# Patient Record
Sex: Female | Born: 1950 | Race: White | Hispanic: No | Smoking: Current some day smoker
Health system: Southern US, Community
[De-identification: ages and names within clinical notes are randomized; demographics above are authoritative.]

## PROBLEM LIST (undated history)

## (undated) DIAGNOSIS — M1990 Unspecified osteoarthritis, unspecified site: Secondary | ICD-10-CM

## (undated) DIAGNOSIS — Z720 Tobacco use: Secondary | ICD-10-CM

## (undated) DIAGNOSIS — F32A Depression, unspecified: Secondary | ICD-10-CM

## (undated) DIAGNOSIS — K635 Polyp of colon: Secondary | ICD-10-CM

## (undated) DIAGNOSIS — K589 Irritable bowel syndrome without diarrhea: Secondary | ICD-10-CM

## (undated) DIAGNOSIS — N200 Calculus of kidney: Secondary | ICD-10-CM

## (undated) HISTORY — DX: Tobacco use: Z72.0

## (undated) HISTORY — DX: Irritable bowel syndrome, unspecified: K58.9

## (undated) HISTORY — PX: OTHER SURGICAL HISTORY: SHX169

## (undated) HISTORY — PX: BREAST BIOPSY: SHX20

## (undated) HISTORY — PX: TUBAL LIGATION: SHX77

## (undated) HISTORY — DX: Unspecified osteoarthritis, unspecified site: M19.90

---

## 2015-03-06 ENCOUNTER — Encounter: Payer: Self-pay | Admitting: Podiatry

## 2015-03-06 ENCOUNTER — Ambulatory Visit (INDEPENDENT_AMBULATORY_CARE_PROVIDER_SITE_OTHER): Payer: Medicare Other | Admitting: Podiatry

## 2015-03-06 ENCOUNTER — Ambulatory Visit (INDEPENDENT_AMBULATORY_CARE_PROVIDER_SITE_OTHER): Payer: Medicare Other

## 2015-03-06 DIAGNOSIS — M2011 Hallux valgus (acquired), right foot: Secondary | ICD-10-CM

## 2015-03-06 DIAGNOSIS — M2021 Hallux rigidus, right foot: Secondary | ICD-10-CM | POA: Diagnosis not present

## 2015-03-06 NOTE — Patient Instructions (Signed)
Pre-Operative Instructions  Congratulations, you have decided to take an important step to improving your quality of life.  You can be assured that the doctors of [facility] will be with you every step of the way.  1. Plan to be at the surgery center/hospital at least 1 (one) hour prior to your scheduled time unless otherwise directed by the surgical center/hospital staff.  You must have a responsible adult accompany you, remain during the surgery and drive you home.  Make sure you have directions to the surgical center/hospital and know how to get there on time. 2. For hospital based surgery you will need to obtain a history and physical form from your family physician within 1 month prior to the date of surgery- we will give you a form for you primary physician.  3. We make every effort to accommodate the date you request for surgery.  There are however, times where surgery dates or times have to be moved.  We will contact you as soon as possible if a change in schedule is required.   4. No Aspirin/Ibuprofen for one week before surgery.  If you are on aspirin, any non-steroidal anti-inflammatory medications (Mobic, Aleve, Ibuprofen) you should stop taking it 7 days prior to your surgery.  You make take Tylenol  For pain prior to surgery.  5. Medications- If you are taking daily heart and blood pressure medications, seizure, reflux, allergy, asthma, anxiety, pain or diabetes medications, make sure the surgery center/hospital is aware before the day of surgery so they may notify you which medications to take or avoid the day of surgery. 6. No food or drink after midnight the night before surgery unless directed otherwise by surgical center/hospital staff. 7. No alcoholic beverages 24 hours prior to surgery.  No smoking 24 hours prior to or 24 hours after surgery. 8. Wear loose pants or shorts- loose enough to fit over bandages, boots, and casts. 9. No slip on shoes, sneakers are best. 10. Bring  your boot with you to the surgery center/hospital.  Also bring crutches or a walker if your physician has prescribed it for you.  If you do not have this equipment, it will be provided for you after surgery. 11. If you have not been contracted by the surgery center/hospital by the day before your surgery, call to confirm the date and time of your surgery. 12. Leave-time from work may vary depending on the type of surgery you have.  Appropriate arrangements should be made prior to surgery with your employer. 13. Prescriptions will be provided immediately following surgery by your doctor.  Have these filled as soon as possible after surgery and take the medication as directed. 14. Remove nail polish on the operative foot. 15. Wash the night before surgery.  The night before surgery wash the foot and leg well with the antibacterial soap provided and water paying special attention to beneath the toenails and in between the toes.  Rinse thoroughly with water and dry well with a towel.  Perform this wash unless told not to do so by your physician.  Enclosed: 1 Ice pack (please put in freezer the night before surgery)   1 Hibiclens skin cleaner   Pre-op Instructions  If you have any questions regarding the instructions, do not hesitate to call our office.  [place]: [address] , [place] [address] [phone]  [place]: [address] [place], [place] [address] [phone]  [place]: [address]  [place], [place] [address] [phone]  Dr. Richard   Tuchman DPM, Dr. Norman Regal DPM Dr. Richard Sikora DPM, Dr. M. Todd Ramona Ruark DPM, Dr. Kathryn Egerton DPM 

## 2015-03-06 NOTE — Progress Notes (Signed)
She presents today as a new patient with a chief complaint pain to the right first metatarsophalangeal joint. She states that it seems to be getting worse over the past 3 months and is becoming intolerable. She states that it has dropped her ability to perform her daily activities and continue to maintain health with exercise. She states that she also has painful ingrown toenails to the hallux bilateral.    objective: vital signs are stable alert and oriented 3. Pulses are strongly palpable. Neurologic sensorium is intact per Semmes-Weinstein monofilament. Deep tendon reflexes are intact bilateral muscle strength +5 over 5 dorsiflexors plantar flexors and inverters and evertors onto the musculature is intact. Orthopedic evaluation demonstrates less than 5 of motion dorsiflexion or plantarflexion at the level of the first metatarsophalangeal joint right foot. Exquisitely painful on palpation. Severe pes planus is noted bilateral. Radiographs do demonstrate moderate to severe pes planus with an elevated first metatarsal subchondral sclerosis of the first metatarsophalangeal joint dorsal spurring and eburnation. All of this is consistent with hallux rigidus and pes planus. Cutaneous evaluation does demonstrate no open lesions or wounds. She does have probable onychomycosis of the hallux nail plate right foot but does demonstrate onychocryptosis of the bilateral hallux nails.   Assessment: ingrown toenails hallux bilateral. 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 Pes planus bilateral. Onychomycosis hallux nail plate right.  Plan: discussed etiology pathology conservative versus surgical therapies. At this point we went over a consent form today line by line number by number giving her ample time to ask questions she saw fit Re: Jake Michaelis arthroplasty with a single silicone implant right foot as well as surgical matrixectomy's tibial and fibular border of the hallux bilateral. We discussed the  pros and cons of the surgery as well as possible complications which may include but are not limited to postop pain bleeding swelling infection recurrence need for further surgery overcorrection under correction loss of digit loss of limb loss of life. We dispensed a cam walker today for her  Postop recovery time. She signed other days of the consent form and I will follow-up with her in the near future. She will allow the hallux nail plate to grow so then I may clip it an sample for fungus.

## 2015-03-09 ENCOUNTER — Telehealth: Payer: Self-pay | Admitting: *Deleted

## 2015-03-09 NOTE — Telephone Encounter (Signed)
"  I'm needing surgery.  I saw Dr. Milinda Pointer yesterday and was told to call you."  Dr. Stephenie Acres next available is 04/21/2015.  Would you like to do it then?  "No, I better do it the end of the month.  I have family coming to visit me.  Can he do it March 31?"  Yes, that date is fine.  I'll schedule then.  You can go ahead and register.  The surgical center will call you with the arrival time a day or two prior to surgery date.

## 2015-03-23 ENCOUNTER — Other Ambulatory Visit: Payer: Self-pay | Admitting: Otolaryngology

## 2015-03-23 DIAGNOSIS — R42 Dizziness and giddiness: Secondary | ICD-10-CM

## 2015-04-06 ENCOUNTER — Ambulatory Visit
Admission: RE | Admit: 2015-04-06 | Discharge: 2015-04-06 | Disposition: A | Discharge status: Home / Self Care | Payer: Medicare Other | Source: Ambulatory Visit | Attending: Otolaryngology | Admitting: Otolaryngology

## 2015-04-06 DIAGNOSIS — R42 Dizziness and giddiness: Secondary | ICD-10-CM | POA: Diagnosis not present

## 2015-04-06 LAB — POCT I-STAT CREATININE: Creatinine, Ser: 0.9 mg/dL (ref 0.44–1.00)

## 2015-04-06 MED ORDER — GADOBENATE DIMEGLUMINE 529 MG/ML IV SOLN
15.0000 mL | Freq: Once | INTRAVENOUS | Status: AC | PRN
  Administered 2015-04-06: 13 mL via INTRAVENOUS

## 2015-05-11 ENCOUNTER — Other Ambulatory Visit: Payer: Self-pay | Admitting: Podiatry

## 2015-05-11 ENCOUNTER — Telehealth: Payer: Self-pay

## 2015-05-11 MED ORDER — PROMETHAZINE HCL 25 MG PO TABS: 25.0000 mg | ORAL_TABLET | Freq: Three times a day (TID) | ORAL | Status: DC | PRN

## 2015-05-11 MED ORDER — HYDROMORPHONE HCL 4 MG PO TABS: 4.0000 mg | ORAL_TABLET | ORAL | Status: DC | PRN

## 2015-05-11 MED ORDER — CEPHALEXIN 500 MG PO CAPS: 500.0000 mg | ORAL_CAPSULE | Freq: Three times a day (TID) | ORAL | Status: DC

## 2015-05-11 NOTE — Telephone Encounter (Signed)
Pt called this morning stating that her insurance called her this morning and told her that her surgery was not approved. She cancelled the surgery. [place] was notified, V. HIll took call from [employer]

## 2015-05-15 ENCOUNTER — Ambulatory Visit (INDEPENDENT_AMBULATORY_CARE_PROVIDER_SITE_OTHER): Payer: Medicare Other | Admitting: Podiatry

## 2015-05-15 ENCOUNTER — Encounter: Payer: Self-pay | Admitting: Podiatry

## 2015-05-15 DIAGNOSIS — L603 Nail dystrophy: Secondary | ICD-10-CM | POA: Diagnosis not present

## 2015-05-15 DIAGNOSIS — M7751 Other enthesopathy of right foot: Secondary | ICD-10-CM | POA: Diagnosis not present

## 2015-05-15 DIAGNOSIS — M5432 Sciatica, left side: Secondary | ICD-10-CM | POA: Diagnosis not present

## 2015-05-15 DIAGNOSIS — M779 Enthesopathy, unspecified: Secondary | ICD-10-CM

## 2015-05-15 DIAGNOSIS — M2021 Hallux rigidus, right foot: Secondary | ICD-10-CM

## 2015-05-15 DIAGNOSIS — M778 Other enthesopathies, not elsewhere classified: Secondary | ICD-10-CM

## 2015-05-15 MED ORDER — METHYLPREDNISOLONE 4 MG PO TBPK: ORAL_TABLET | ORAL | Status: DC

## 2015-05-15 MED ORDER — MELOXICAM 15 MG PO TABS: 15.0000 mg | ORAL_TABLET | Freq: Every day | ORAL | Status: DC

## 2015-05-15 NOTE — Progress Notes (Signed)
She presents today for follow-up of her severely painful first metatarsophalangeal joint of her right foot. She states that her foot is so painful she can hardly perform her daily activities. She has been treated in the past for the same foot but in [facility]. She states that it seems the insurance does not want her to get better so she can continue a healthy way of life. She is also complaining of a thickened discolored hallux nail plate right. She would like to go ahead and have this much conservative therapy is possible so that she may be able to at some point have a surgical correction.  Objective: Vital signs are stable she is alert and oriented 3. Pulses are strongly palpable. She has severe pain on range of motion of the first metatarsophalangeal joint right foot. There is a large spur resulting in near ulceration to the dorsal medial aspect of the first metatarsophalangeal joint this is limiting her ability to wear good shoes gear. She has pain throughout the rest of the foot particularly right because of her compensation for the first metatarsophalangeal joint. She does have discoloration of the hallux nail plate right. This nail plate does demonstrate what appears to be either a yeast or fungus.  Assessment: Chronic intractable hallux rigidus with severe pain about the first metatarsophalangeal joint right foot. Nail dystrophy hallux right.  Plan: Discussed etiology pathology conservative versus surgical therapies. I recommended an injection today of Kenalog and local anesthetic. Perform this after sterile Betadine skin prep. Also placed her on a Medrol Dosepak to be followed by meloxicam. We discussed appropriate shoe gear stretching exercises and ice therapy we did sample of the toenail today sent for pathologic evaluation and I will follow-up with her in the near future.

## 2015-05-17 ENCOUNTER — Encounter: Payer: Self-pay | Admitting: Podiatry

## 2015-06-14 ENCOUNTER — Ambulatory Visit: Payer: Medicare Other | Admitting: Podiatry

## 2015-06-28 ENCOUNTER — Ambulatory Visit (INDEPENDENT_AMBULATORY_CARE_PROVIDER_SITE_OTHER): Payer: Medicare Other

## 2015-06-28 ENCOUNTER — Ambulatory Visit (INDEPENDENT_AMBULATORY_CARE_PROVIDER_SITE_OTHER): Payer: Medicare Other | Admitting: Podiatry

## 2015-06-28 ENCOUNTER — Encounter: Payer: Self-pay | Admitting: Podiatry

## 2015-06-28 DIAGNOSIS — M7751 Other enthesopathy of right foot: Secondary | ICD-10-CM | POA: Diagnosis not present

## 2015-06-28 DIAGNOSIS — M779 Enthesopathy, unspecified: Secondary | ICD-10-CM

## 2015-06-28 DIAGNOSIS — M2021 Hallux rigidus, right foot: Secondary | ICD-10-CM | POA: Diagnosis not present

## 2015-06-28 DIAGNOSIS — M778 Other enthesopathies, not elsewhere classified: Secondary | ICD-10-CM

## 2015-06-28 NOTE — Progress Notes (Signed)
She presents today for follow-up of her hallux limitus first metatarsal-phalangeal joint right foot. She states that after the last injection it felt much better. She still has some tenderness to the plantar aspect of the first metatarsophalangeal joint right. We took a nail sample last visit which we will discuss today.  Objective: Vital signs are stable she's alert and oriented 3 she has tenderness on palpation plantar aspect of the first metatarsophalangeal joint right. Pathology report does demonstrate negative fungus.  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 Reinjected plantar aspect of the first metatarsophalangeal joint today with Kenalog and local anesthetic right foot. I will follow up with her in 6 weeks at which time we will discuss a Keller arthroplasty with a single silicone implant right foot to be performed in August.

## 2015-08-02 ENCOUNTER — Encounter: Payer: Self-pay | Admitting: Podiatry

## 2015-08-02 ENCOUNTER — Ambulatory Visit (INDEPENDENT_AMBULATORY_CARE_PROVIDER_SITE_OTHER): Payer: Medicare Other | Admitting: Podiatry

## 2015-08-02 DIAGNOSIS — M7751 Other enthesopathy of right foot: Secondary | ICD-10-CM | POA: Diagnosis not present

## 2015-08-02 DIAGNOSIS — M2021 Hallux rigidus, right foot: Secondary | ICD-10-CM | POA: Diagnosis not present

## 2015-08-02 DIAGNOSIS — M778 Other enthesopathies, not elsewhere classified: Secondary | ICD-10-CM

## 2015-08-02 DIAGNOSIS — M779 Enthesopathy, unspecified: Secondary | ICD-10-CM

## 2015-08-02 NOTE — Progress Notes (Signed)
She presents today for surgical consult regarding hallux limitus first metatarsophalangeal joint right foot. She states that it is more painful than ever I'm really disappointed that insurance did not allow me to have the surgery performed initially because now it has started to affect all facets of life. She states that the last injection seemed to alleviate symptoms for a few days and she would like to try another injection. She is ready to have the surgery performed as soon as possible.  Objective: Vital signs are stable alert and oriented 3. Pulses are palpable. Neurologic sensorium is intact per Semmes-Weinstein monofilament. She has severe pain no palpation and range of motion of first metatarsophalangeal joint right foot. Radiographs reviewed today do demonstrate joint space narrowing subchondral sclerosis dorsal spurring and bone-on-bone contact.  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  Plan: We went over a consent form today line by line number by number giving her ample time to ask questions she sought that regarding this procedure she re-signed the same consent form that she had signed in the past and we discussed the possible postop complications. I also injected 1 mg of dexamethasone into the first metatarsophalangeal joint with local anesthetic. This alleviated her symptoms immediately.

## 2015-08-02 NOTE — Patient Instructions (Signed)

## 2016-03-22 ENCOUNTER — Ambulatory Visit (INDEPENDENT_AMBULATORY_CARE_PROVIDER_SITE_OTHER): Payer: Medicare Other | Admitting: Podiatry

## 2016-03-22 VITALS — BP 98/72 | HR 84

## 2016-03-22 DIAGNOSIS — M7751 Other enthesopathy of right foot: Secondary | ICD-10-CM

## 2016-03-22 DIAGNOSIS — M205X1 Other deformities of toe(s) (acquired), right foot: Secondary | ICD-10-CM | POA: Diagnosis not present

## 2016-03-22 DIAGNOSIS — M21611 Bunion of right foot: Secondary | ICD-10-CM

## 2016-03-22 DIAGNOSIS — M2011 Hallux valgus (acquired), right foot: Secondary | ICD-10-CM | POA: Diagnosis not present

## 2016-04-05 MED ORDER — BETAMETHASONE SOD PHOS & ACET 6 (3-3) MG/ML IJ SUSP: 3.0000 mg | Freq: Once | INTRAMUSCULAR | Status: DC

## 2016-04-05 NOTE — Progress Notes (Signed)
   Subjective:  Patient presents today for follow-up evaluation of hallux limitus with bunion deformity first metatarsal phalangeal joint right foot. She states that approximately 2 weeks ago she began to experience significant pain. Patient was last seen 08/02/2015 under the care of Dr. Milinda Pointer. At that time a surgical consult was given regarding hallux limitus surgery right foot. Patient presents today for follow-up treatment and evaluation    Objective/Physical Exam General: The patient is alert and oriented x3 in no acute distress.  Dermatology: Skin is warm, dry and supple bilateral lower extremities. Negative for open lesions or macerations.  Vascular: Palpable pedal pulses bilaterally. No edema or erythema noted. Capillary refill within normal limits.  Neurological: Epicritic and protective threshold grossly intact bilaterally.   Musculoskeletal Exam: Pain on palpation and range of motion to the first metatarsal phalangeal joint right foot. Minimal edema noted. Range of motion within normal limits to all pedal and ankle joints bilateral. Muscle strength 5/5 in all groups bilateral.   Assessment: #1 hallux limitus with bunion deformity first metatarsal phalangeal joint right foot. #2 metatarsal phalangeal joint capsulitis right foot   Plan of Care:  #1 Patient was evaluated. #2 injection of 0.5 mL Celestone Soluspan injected in the first MPJ right foot to alleviate symptoms and acute pain #3 recommend follow-up with Dr. Milinda Pointer for possible surgical consult and intervention.   Edrick Kins, DPM [facility]  Dr. Edrick Kins, [place]                                        [place], [place] [address]                Office [phone]  Fax [phone]

## 2016-07-24 ENCOUNTER — Ambulatory Visit (INDEPENDENT_AMBULATORY_CARE_PROVIDER_SITE_OTHER): Payer: Medicare Other | Admitting: Podiatry

## 2016-07-24 ENCOUNTER — Encounter: Payer: Self-pay | Admitting: Podiatry

## 2016-07-24 DIAGNOSIS — M2011 Hallux valgus (acquired), right foot: Secondary | ICD-10-CM | POA: Diagnosis not present

## 2016-07-24 DIAGNOSIS — L600 Ingrowing nail: Secondary | ICD-10-CM

## 2016-07-24 DIAGNOSIS — M205X1 Other deformities of toe(s) (acquired), right foot: Secondary | ICD-10-CM

## 2016-07-24 NOTE — Progress Notes (Signed)
Presents today for a follow-up of her hallux limitus. She states that on here schedule surgery but I really like to have an injection.  Objective: Vital signs are stable she is alert and oriented 3 still has severe pain on palpation and range of motion of the first metatarsophalangeal joint of the right foot. I revisited her old radiographs which do demonstrate joint space narrowing supposed porosis and dorsal spurring. She has pain on end range of motion today and physical exam and distraction of the toe.  Assessment: Hallux limitus hallux rigidus first metatarsophalangeal joint right foot capsulitis first metatarsophalangeal joint right foot.  Plan: I injected the area today after sterile Betadine skin prep with dexamethasone and local anesthetic. I also scheduled for surgical intervention today consisting of a Austin bunion repair with screw fixation possible Keller arthroplasty with a single silicone implant and matrixectomy tibial fibular border of the hallux bilateral. We did discuss possible postop complications which may include but are not limited to postop pain bleeding swelling infection recurrence need for further surgery. I answered all the questions regarding these procedures best my ability limits times. We dispensed a cam walker for her postop recovery period. Dispense both oral and written home-going instructions for the care and preop instructions. Follow up with her in the near future surgical intervention.

## 2016-07-24 NOTE — Patient Instructions (Signed)
Pre-Operative Instructions  Congratulations, you have decided to take an important step to improving your quality of life.  You can be assured that the doctors of [facility] will be with you every step of the way.  1. Plan to be at the surgery center/hospital at least 1 (one) hour prior to your scheduled time unless otherwise directed by the surgical center/hospital staff.  You must have a responsible adult accompany you, remain during the surgery and drive you home.  Make sure you have directions to the surgical center/hospital and know how to get there on time. 2. For hospital based surgery you will need to obtain a history and physical form from your family physician within 1 month prior to the date of surgery- we will give you a form for you primary physician.  3. We make every effort to accommodate the date you request for surgery.  There are however, times where surgery dates or times have to be moved.  We will contact you as soon as possible if a change in schedule is required.   4. No Aspirin/Ibuprofen for one week before surgery.  If you are on aspirin, any non-steroidal anti-inflammatory medications (Mobic, Aleve, Ibuprofen) you should stop taking it 7 days prior to your surgery.  You make take Tylenol  For pain prior to surgery.  5. Medications- If you are taking daily heart and blood pressure medications, seizure, reflux, allergy, asthma, anxiety, pain or diabetes medications, make sure the surgery center/hospital is aware before the day of surgery so they may notify you which medications to take or avoid the day of surgery. 6. No food or drink after midnight the night before surgery unless directed otherwise by surgical center/hospital staff. 7. No alcoholic beverages 24 hours prior to surgery.  No smoking 24 hours prior to or 24 hours after surgery. 8. Wear loose pants or shorts- loose enough to fit over bandages, boots, and casts. 9. No slip on shoes, sneakers are best. 10. Bring  your boot with you to the surgery center/hospital.  Also bring crutches or a walker if your physician has prescribed it for you.  If you do not have this equipment, it will be provided for you after surgery. 11. If you have not been contracted by the surgery center/hospital by the day before your surgery, call to confirm the date and time of your surgery. 12. Leave-time from work may vary depending on the type of surgery you have.  Appropriate arrangements should be made prior to surgery with your employer. 13. Prescriptions will be provided immediately following surgery by your doctor.  Have these filled as soon as possible after surgery and take the medication as directed. 14. Remove nail polish on the operative foot. 15. Wash the night before surgery.  The night before surgery wash the foot and leg well with the antibacterial soap provided and water paying special attention to beneath the toenails and in between the toes.  Rinse thoroughly with water and dry well with a towel.  Perform this wash unless told not to do so by your physician.  Enclosed: 1 Ice pack (please put in freezer the night before surgery)   1 Hibiclens skin cleaner   Pre-op Instructions  If you have any questions regarding the instructions, do not hesitate to call our office.  [place]: [address] [place], [place] [address] [phone]  [place]: [address] [place], [place] [address] [phone]  [place]: [address]  [place], [place] [address] [phone]   Dr.   Norman Regal DPM, Dr. Matthew Wagoner DPM, Dr. M. Todd Darnell Jeschke DPM, Dr. Titorya Stover DPM 

## 2016-07-26 ENCOUNTER — Telehealth: Payer: Self-pay | Admitting: *Deleted

## 2016-07-26 NOTE — Telephone Encounter (Signed)
I'm returning your call.  Do you have a date in mind that you would like to schedule surgery?  "What is his first available?"  He an do it on June 28.  "That is too soon.  I will have to wait because I'm in a play.  What is available after that?"  He can do it July 6, 20 or 27.  "Let me call you back.  I am driving, on my way home.  I'll call you back when I get there and check my calendar.  Is it okay to leave you a message?"  Yes, you can leave me a message.

## 2016-07-26 NOTE — Telephone Encounter (Signed)
I'm calling you back.  You gave me the option of July 6 for foot surgery.  I would like that date.  I have [facility].  Can you be sure and let me know that they will cover the surgery?"

## 2016-07-26 NOTE — Telephone Encounter (Signed)
"  Dr. Milinda Pointer I think got paperwork to you yesterday about scheduling foot surgery.  I'd like to do that as soon as possible.  If you could call me, I'd greatly appreciate it."

## 2016-07-29 NOTE — Telephone Encounter (Addendum)
"  I just wanted to make sure you got my message that I wanted July 6 for my foot surgery.  Call and confirm that my [facility] is going to cover it for sure because I have to line up sitters for the dog and all this stuff.  Just let me know when you know.  I called and left her a message that I have her scheduled for July 6.  I will have to put in a prior authorization with [facility].  I will call once I get a response from [facility].

## 2016-08-13 ENCOUNTER — Other Ambulatory Visit: Payer: Self-pay | Admitting: Podiatry

## 2016-08-13 MED ORDER — CEPHALEXIN 500 MG PO CAPS: 500.0000 mg | ORAL_CAPSULE | Freq: Three times a day (TID) | ORAL | 0 refills | Status: DC

## 2016-08-13 MED ORDER — HYDROMORPHONE HCL 4 MG PO TABS: 4.0000 mg | ORAL_TABLET | ORAL | 0 refills | Status: DC | PRN

## 2016-08-13 MED ORDER — PROMETHAZINE HCL 25 MG PO TABS: 25.0000 mg | ORAL_TABLET | Freq: Three times a day (TID) | ORAL | 0 refills | Status: DC | PRN

## 2016-08-13 NOTE — Telephone Encounter (Signed)
I am calling to let you know that authorization is not required from your insurance.  "That's great to hear that they will cover it.  My insurance last year decided the day before that it wasn't covered.  So I have a deductible, I guess I will have to pay at $100 when I get to the facility, correct?"  I am not sure what you may owe on their end.  On our end, you do not have a deductible.  "Okay, well praise God.  Thank you so much Sally Wilson."

## 2016-08-16 DIAGNOSIS — L600 Ingrowing nail: Secondary | ICD-10-CM | POA: Diagnosis not present

## 2016-08-16 DIAGNOSIS — M2011 Hallux valgus (acquired), right foot: Secondary | ICD-10-CM | POA: Diagnosis not present

## 2016-08-21 ENCOUNTER — Telehealth: Payer: Self-pay | Admitting: Podiatry

## 2016-08-21 MED ORDER — NONFORMULARY OR COMPOUNDED ITEM: 2 refills | Status: DC

## 2016-08-21 NOTE — Telephone Encounter (Signed)
I had surgery done by Dr. Milinda Pointer this past Friday 06 July. Everything is going good. However, the antibiotic prescribed has caused me to have both non-allergic and allergic reactions. I now have thrush (a rash) on the inside of my mouth, on my tongue, and my genital's. I don't know what to do. What should I do about the fungal infection that I now have? I do not want to take another antibiotic today as I took one on Friday, Saturday, Sunday, Monday, and Tuesday. Please call me back at [phone].

## 2016-08-21 NOTE — Telephone Encounter (Addendum)
I spoke with pt, she informed me of her current symptoms, and denies any shortness of breath or difficulty breathing. I told her to stop the cephalexin and begin benadryl and I would see if the doctor on call wanted to change the antibiotic and prescribe a medication for the thrush. Pt states understanding. Dr. Amalia Hailey ordered discontinue the cephalexin, and go to PCP if continued problem with sores in the mouth, and ED if shortness of breath or difficulty breathing, and Duke's Magic Mouthwash as directed. Orders to pt. [facility] - [phone], I spoke with pharmacist and she states they do mix the Duke's Magic Mouthwash - Benadryl, Hydrocortisone tablet and Nystatin swish and swallow 44ml 3-4 times daily.

## 2016-08-23 ENCOUNTER — Ambulatory Visit (INDEPENDENT_AMBULATORY_CARE_PROVIDER_SITE_OTHER): Payer: Self-pay | Admitting: Podiatry

## 2016-08-23 ENCOUNTER — Ambulatory Visit (INDEPENDENT_AMBULATORY_CARE_PROVIDER_SITE_OTHER): Payer: Medicare Other

## 2016-08-23 VITALS — BP 109/76 | HR 78 | Temp 98.2°F | Resp 16

## 2016-08-23 DIAGNOSIS — Z9889 Other specified postprocedural states: Secondary | ICD-10-CM

## 2016-08-23 DIAGNOSIS — M79671 Pain in right foot: Secondary | ICD-10-CM | POA: Diagnosis not present

## 2016-08-23 MED ORDER — AMOXICILLIN-POT CLAVULANATE 875-125 MG PO TABS: 1.0000 | ORAL_TABLET | Freq: Two times a day (BID) | ORAL | 0 refills | Status: DC

## 2016-08-23 MED ORDER — FLUCONAZOLE 150 MG PO TABS: 150.0000 mg | ORAL_TABLET | Freq: Once | ORAL | 0 refills | Status: AC

## 2016-09-01 NOTE — Progress Notes (Signed)
   Subjective:  Patient presents today status post right bunionectomy of bilateral ingrown toenail removal performed by Dr. Milinda Pointer. Date of surgery 08/16/2016. Patient states that she's doing well and that the pain is tolerable. Patient complains of slight redness and swelling to the left great toe. She states that the right great toenail is doing okay.    Objective/Physical Exam Skin incisions appear to be well coapted with sutures and staples intact. No sign of infectious process noted. No dehiscence. No active bleeding noted. Moderate edema noted to the surgical extremity. There is some mild erythema localized to the left great toe.  Radiographic Exam:  Orthopedic hardware and osteotomies sites appear to be stable with routine healing.  Assessment: 1. s/p Austin bunionectomy right with bilateral ingrown toenail removal procedures 2. Mild cellulitis left great toe  Plan of Care:  1. Patient was evaluated. X-rays reviewed 2. Prescription for Augmentin placed. Discontinue Keflex since it caused her oral thrush and yeast infection reaction 3. Prescription for Diflucan to address [place] infection secondary to foot surgery empiric treatment 4. Return to clinic in 1 week with Dr. Milinda Pointer   Edrick Kins, DPM [facility]  Dr. Edrick Kins, [place]                                        [place], [place] [address]                Office [phone]  Fax [phone]

## 2016-09-02 ENCOUNTER — Ambulatory Visit (INDEPENDENT_AMBULATORY_CARE_PROVIDER_SITE_OTHER): Payer: Medicare Other | Admitting: Podiatry

## 2016-09-02 ENCOUNTER — Encounter: Payer: Self-pay | Admitting: Podiatry

## 2016-09-02 DIAGNOSIS — M205X1 Other deformities of toe(s) (acquired), right foot: Secondary | ICD-10-CM | POA: Diagnosis not present

## 2016-09-02 DIAGNOSIS — L600 Ingrowing nail: Secondary | ICD-10-CM

## 2016-09-02 NOTE — Progress Notes (Signed)
She presents today for follow-up of her Liane Comber bunionectomy right and her excision of nails hallux bilaterally. She states that she is doing pretty good it is burns on the sides of those toes with a matrixectomy was performed. She denies fever chills nausea or muscle aches and pains measurements of breath Pain or chest pain. Date of surgery 08/16/2016.  Objective: Dry sterile dressing intact was removed demonstrates sutures are intact margins well coapted. No erythema cellulitis drainage or odor. Good range of motion of the first metatarsophalangeal joint of the right foot status post [place] and single silicone implant mildly tender.  Assessment: Date of surgery 08/16/2016. Status post Keller arthroplasty single silicone implant right foot matrixectomy hallux bilateral.  Plan: Posterior compression anklet today removed all of the sutures left for getting this wet follow-up with her in 2 weeks for another set of x-rays encourage range of motion exercises demonstrated in for her follow-up with her at that time.

## 2016-09-11 ENCOUNTER — Ambulatory Visit (INDEPENDENT_AMBULATORY_CARE_PROVIDER_SITE_OTHER): Payer: Self-pay | Admitting: Podiatry

## 2016-09-11 DIAGNOSIS — L03032 Cellulitis of left toe: Secondary | ICD-10-CM

## 2016-09-11 MED ORDER — DOXYCYCLINE HYCLATE 100 MG PO TABS: 100.0000 mg | ORAL_TABLET | Freq: Two times a day (BID) | ORAL | 0 refills | Status: DC

## 2016-09-11 MED ORDER — NEOMYCIN-POLYMYXIN-HC 3.5-10000-1 OT SOLN: OTIC | 0 refills | Status: DC

## 2016-09-11 NOTE — Progress Notes (Signed)
She presents today for follow-up of her bunionectomy right foot and matrixectomy's hallux bilaterally. Date of surgery was July 6. She is concerned about the swelling and the drainage from the left hallux matrixectomy site. She states that 2 antibiotics Keflex which she was allergic to and amoxicillin she could not take very well really has not helped at all. She continues to soak twice a day.  Objective: She denies fever chills nausea vomiting muscle aches and pains. Pulses are strongly palpable left foot. Mild erythema with serosanguineous drainage along the eponychial of the left hallux.  Assessment: Paronychia left hallux.  Plan: We will start her on once a day Epsom salt soaks also want to start her on Cortisporin Otic be applied after soaking daily. She will cover the toe during the day but leave open at bedtime. She will also start doxycycline. Should she have Problems taken that she will notify us immediately.

## 2016-09-16 ENCOUNTER — Encounter: Payer: Medicare Other | Admitting: Podiatry

## 2016-09-23 ENCOUNTER — Encounter: Payer: Self-pay | Admitting: Podiatry

## 2016-09-23 ENCOUNTER — Ambulatory Visit (INDEPENDENT_AMBULATORY_CARE_PROVIDER_SITE_OTHER): Payer: Medicare Other

## 2016-09-23 ENCOUNTER — Ambulatory Visit (INDEPENDENT_AMBULATORY_CARE_PROVIDER_SITE_OTHER): Payer: Medicare Other | Admitting: Podiatry

## 2016-09-23 DIAGNOSIS — M205X1 Other deformities of toe(s) (acquired), right foot: Secondary | ICD-10-CM

## 2016-09-23 DIAGNOSIS — L600 Ingrowing nail: Secondary | ICD-10-CM

## 2016-09-23 DIAGNOSIS — S92501A Displaced unspecified fracture of right lesser toe(s), initial encounter for closed fracture: Secondary | ICD-10-CM | POA: Diagnosis not present

## 2016-09-23 NOTE — Progress Notes (Signed)
She presents today postop visit date of surgery 08/16/2016 status post Jake Michaelis arthroplasty single silicone implant right foot excision nail hallux bilateral. She also states that she hit her little toe on a chair in her bathroom.  Objective: Vital signs are stable alert and oriented 3. Pulses are palpable. Neurologic sensory is intact. Deep tendon reflexes are intact muscle strength +5 over 5 dorsiflexion plantar flexors and inverters everters great first metatarsophalangeal joint range of motion swollen ecchymotic fifth digit of the right foot painful palpation at the level of metatarsophalangeal joint or proximal proximal phalanx. Radiographs taken today do demonstrate a complete fracture of the base of the proximal phalanx does appear to be intra-articular minimally displaced.  Assessment: Well-healing surgical foot fracture fifth digit right.  Plan: Wrap the fifth toe for her today will follow up with her in a month for another set of x-rays.

## 2016-10-16 ENCOUNTER — Encounter: Payer: Self-pay | Admitting: Podiatry

## 2016-10-16 NOTE — Progress Notes (Signed)
DOS [id number] [place] BUNIONECTOMY RIGHT POSSIBLE KELLER BUNION IMPLANT RIGHT EXC NAILPERM 1ST B/L

## 2016-10-21 ENCOUNTER — Encounter: Payer: Medicare Other | Admitting: Podiatry

## 2016-10-28 ENCOUNTER — Ambulatory Visit (INDEPENDENT_AMBULATORY_CARE_PROVIDER_SITE_OTHER): Payer: Medicare Other

## 2016-10-28 ENCOUNTER — Ambulatory Visit (INDEPENDENT_AMBULATORY_CARE_PROVIDER_SITE_OTHER): Payer: Self-pay | Admitting: Podiatry

## 2016-10-28 ENCOUNTER — Encounter: Payer: Self-pay | Admitting: Podiatry

## 2016-10-28 DIAGNOSIS — M205X1 Other deformities of toe(s) (acquired), right foot: Secondary | ICD-10-CM

## 2016-10-28 DIAGNOSIS — S92501D Displaced unspecified fracture of right lesser toe(s), subsequent encounter for fracture with routine healing: Secondary | ICD-10-CM

## 2016-10-28 DIAGNOSIS — L600 Ingrowing nail: Secondary | ICD-10-CM

## 2016-10-28 NOTE — Progress Notes (Signed)
She presents today for a follow-up of her Sally Wilson arthroplasty single silicone implant right foot. She states that she was doing very well until she hurt her toe ball dancing in a bar with a stranger. She states that she went up on her toe and twisted and felt a sharp pain.  Objective: Vital signs are stable she is alert and oriented 3. Pulses are palpable area she has pain on palpation of the hallux interphalangeal joint and the distal most aspect of the proximal phalanx. Radiographs taken today do demonstrate a nondisplaced fracture distal aspect of the proximal phalanx.  Assessment: I'm concerned along see her fracture like this because of the distal stem. Fracture fifth toe as well as proximal phalanx.  Plan: Follow up with me in 1 month and I recommended that she wear her Darco shoe.

## 2016-11-27 ENCOUNTER — Ambulatory Visit: Payer: Medicare Other | Admitting: Podiatry

## 2016-12-09 ENCOUNTER — Ambulatory Visit: Payer: Medicare Other | Admitting: Podiatry

## 2016-12-09 NOTE — Progress Notes (Signed)
Cardiology Office Note  Date:  12/10/2016   ID:  Sally Wilson, DOB 09/08/1950, MRN [id number]  PCP:  Patient, No Pcp Per   Chief Complaint  Patient presents with  . Other    New Patient. Self referral Patietn states she has not been seen in about 7 years and just wants a check up. Meds reviewed verbally with patient.     HPI:  Sally Wilson is a 66 year old woman with history of Smoking since age 53 No major cardiac issues per the patient Moved from [place] 2 years, previously followed by cardiology in [place] Husband died of PE No diabetes No HTN No high cholersterol per the patient Who presents to establish care in the [place] office with history of coronary disease and CHF  Smokes 1 pack/day No regular exercise program Denies any significant chest pain on exertion No significant lower extremity edema, PND, shortness of breath  Reports having stress test perhaps 7 years ago, was told everything was okay Records have been requested  EKG personally reviewed by myself on todays visit Shows normal sinus rhythm with rate 74 bpm T wave abnormality 3 and aVF left axis deviation No prior EKGs for comparison  Family hx  Father had stent, CHF   PMH:   has a past medical history of Arthritis and IBS (irritable bowel syndrome).  PSH:   History reviewed. No pertinent surgical history.  Current Outpatient Prescriptions  Medication Sig Dispense Refill  . clonazePAM (KLONOPIN) 1 MG tablet   0  . PAXIL CR 25 MG 24 hr tablet   0  . traZODone (DESYREL) 100 MG tablet   0   Current Facility-Administered Medications  Medication Dose Route Frequency Provider Last Rate Last Dose  . betamethasone acetate-betamethasone sodium phosphate (CELESTONE) injection 3 mg  3 mg Intramuscular Once Edrick Kins, DPM         Allergies:   Cephalexin and Codeine   Social History:  The patient  reports that she has been smoking.  She has never used smokeless tobacco. She reports that she does not  drink alcohol or use drugs.   Family History:   family history is not on file.    Review of Systems: Review of Systems  Constitutional: Negative.   Respiratory: Negative.   Cardiovascular: Negative.   Gastrointestinal: Negative.   Musculoskeletal: Negative.   Neurological: Negative.   Psychiatric/Behavioral: Negative.   All other systems reviewed and are negative.    PHYSICAL EXAM: VS:  BP 110/66 (BP Location: Left Arm, Patient Position: Sitting, Cuff Size: Normal)   Pulse 74   Ht 5' 6.5" (1.689 m)   Wt 126 lb 8 oz (57.4 kg)   BMI 20.11 kg/m  , BMI Body mass index is 20.11 kg/m. GEN: Thin,  well developed, in no acute distress  HEENT: normal  Neck: no JVD, carotid bruits, or masses Cardiac: RRR; no murmurs, rubs, or gallops,no edema  Respiratory:  clear to auscultation bilaterally, normal work of breathing GI: soft, nontender, nondistended, + BS MS: no deformity or atrophy  Skin: warm and dry, no rash Neuro:  Strength and sensation are intact Psych: euthymic mood, full affect    Recent Labs: No results found for requested labs within last 8760 hours.    Lipid Panel No results found for: CHOL, HDL, LDLCALC, TRIG    Wt Readings from Last 3 Encounters:  12/10/16 126 lb 8 oz (57.4 kg)       ASSESSMENT AND PLAN:  Smoker - Plan: EKG 12-Lead,  CT CARDIAC SCORING Long discussion concerning her smoking, 1 pack/day since age 65 Discussed various strategies for smoking cessation More than 5 minutes spent discussing with her She has tried various modalities in the past, does not want Chantix at this time She will try to quit on her own Partner/boyfriend also smokes  Family history of coronary arteriosclerosis - Plan: EKG 12-Lead, CT CARDIAC SCORING Discussed her family history with her, discussed various types of risk stratification techniques/imaging studies including CT coronary calcium scoring and carotid ultrasound  Abnormal EKG - Plan: EKG 12-Lead, CT CARDIAC  SCORING Nonspecific T wave abnormality inferior leads III and aVF she is asymptomatic, no prior EKGs available for comparison.  Discussed various imaging risk stratification studies available and stress testing.  Discussed risk and benefit of each study She prefers to have CT coronary calcium scoring in January 2019 She will call to schedule on her own  Disposition:   F/U as needed   Total encounter time more than 45 minutes  Greater than 50% was spent in counseling and coordination of care with the patient    Orders Placed This Encounter  Procedures  . CT CARDIAC SCORING  . EKG 12-Lead     Signed, Esmond Plants, M.D., Ph.D. 12/10/2016  [place], [place]

## 2016-12-10 ENCOUNTER — Encounter: Payer: Self-pay | Admitting: Cardiovascular Disease

## 2016-12-10 ENCOUNTER — Ambulatory Visit (INDEPENDENT_AMBULATORY_CARE_PROVIDER_SITE_OTHER): Payer: Medicare Other | Admitting: Cardiovascular Disease

## 2016-12-10 VITALS — BP 110/66 | HR 74 | Ht 66.5 in | Wt 126.5 lb

## 2016-12-10 DIAGNOSIS — R9431 Abnormal electrocardiogram [ECG] [EKG]: Secondary | ICD-10-CM

## 2016-12-10 DIAGNOSIS — Z720 Tobacco use: Secondary | ICD-10-CM | POA: Insufficient documentation

## 2016-12-10 DIAGNOSIS — F17200 Nicotine dependence, unspecified, uncomplicated: Secondary | ICD-10-CM

## 2016-12-10 DIAGNOSIS — Z8249 Family history of ischemic heart disease and other diseases of the circulatory system: Secondary | ICD-10-CM | POA: Diagnosis not present

## 2016-12-10 NOTE — Patient Instructions (Addendum)
Medication Instructions:   No medication changes made  Labwork:  No new labs needed  Testing/Procedures:  We will place an order for CT coronary calcium score Please call [phone] to schedule this at your convenience There is a one-time fee of $150 due at the time of your procedure [address], [place]: It was a pleasure seeing you in the office today. Please call us if you have new issues that need to be addressed before your next appt.  [phone]  Your physician wants you to follow-up in:  As needed  If you need a refill on your cardiac medications before your next appointment, please call your pharmacy.     Coronary Calcium Scan A coronary calcium scan is an imaging test used to look for deposits of calcium and other fatty materials (plaques) in the inner lining of the blood vessels of the heart (coronary arteries). These deposits of calcium and plaques can partly clog and narrow the coronary arteries without producing any symptoms or warning signs. This puts a person at risk for a heart attack. This test can detect these deposits before symptoms develop. Tell a health care provider about:  Any allergies you have.  All medicines you are taking, including vitamins, herbs, eye drops, creams, and over-the-counter medicines.  Any problems you or family members have had with anesthetic medicines.  Any blood disorders you have.  Any surgeries you have had.  Any medical conditions you have.  Whether you are pregnant or may be pregnant. What are the risks? Generally, this is a safe procedure. However, problems may occur, including:  Harm to a pregnant woman and her unborn baby. This test involves the use of radiation. Radiation exposure can be dangerous to a pregnant woman and her unborn baby. If you are pregnant, you generally should not have this procedure done.  Slight increase in the risk of cancer. This is because of the  radiation involved in the test.  What happens before the procedure? No preparation is needed for this procedure. What happens during the procedure?  You will undress and remove any jewelry around your neck or chest.  You will put on a hospital gown.  Sticky electrodes will be placed on your chest. The electrodes will be connected to an electrocardiogram (ECG) machine to record a tracing of the electrical activity of your heart.  A CT scanner will take pictures of your heart. During this time, you will be asked to lie still and hold your breath for 2-3 seconds while a picture of your heart is being taken. The procedure may vary among health care providers and hospitals. What happens after the procedure?  You can get dressed.  You can return to your normal activities.  It is up to you to get the results of your test. Ask your health care provider, or the department that is doing the test, when your results will be ready. Summary  A coronary calcium scan is an imaging test used to look for deposits of calcium and other fatty materials (plaques) in the inner lining of the blood vessels of the heart (coronary arteries).  Generally, this is a safe procedure. Tell your health care provider if you are pregnant or may be pregnant.  No preparation is needed for this procedure.  A CT scanner will take pictures of your heart.  You can return to your normal activities after the scan is done. This information is not intended to replace advice given  to you by your health care provider. Make sure you discuss any questions you have with your health care provider. Document Released: 07/27/2007 Document Revised: 12/18/2015 Document Reviewed: 12/18/2015 Elsevier Interactive Patient Education  2017 [employer].

## 2016-12-11 ENCOUNTER — Telehealth: Payer: Self-pay | Admitting: *Deleted

## 2016-12-11 NOTE — Telephone Encounter (Signed)
Medical records received by [facility] as requested by [facility] . Medical records sent to office through In-office mail 12-11-16

## 2016-12-18 ENCOUNTER — Encounter: Payer: Self-pay | Admitting: Podiatry

## 2016-12-18 ENCOUNTER — Ambulatory Visit (INDEPENDENT_AMBULATORY_CARE_PROVIDER_SITE_OTHER): Payer: Medicare Other

## 2016-12-18 ENCOUNTER — Ambulatory Visit: Payer: Medicare Other | Admitting: Podiatry

## 2016-12-18 DIAGNOSIS — L600 Ingrowing nail: Secondary | ICD-10-CM | POA: Diagnosis not present

## 2016-12-18 DIAGNOSIS — M205X1 Other deformities of toe(s) (acquired), right foot: Secondary | ICD-10-CM | POA: Diagnosis not present

## 2016-12-18 DIAGNOSIS — S92501D Displaced unspecified fracture of right lesser toe(s), subsequent encounter for fracture with routine healing: Secondary | ICD-10-CM | POA: Diagnosis not present

## 2016-12-18 NOTE — Progress Notes (Signed)
She presents today for her follow-up of her Sally Wilson arthroplasty of her right foot. She states that she is doing very well. She still has numbness and tingling.  Objective: Vital signs are stable she's alert and oriented 3 she's great range of motion the first metatarsophalangeal joint on palpation of the fifth digit which was fractured.  assessment: well-healing surgical foot.  plan: all with me on an as-needed basis.

## 2017-06-20 DIAGNOSIS — F32A Depression, unspecified: Secondary | ICD-10-CM | POA: Insufficient documentation

## 2017-06-20 DIAGNOSIS — F329 Major depressive disorder, single episode, unspecified: Secondary | ICD-10-CM | POA: Insufficient documentation

## 2018-03-10 DIAGNOSIS — G8929 Other chronic pain: Secondary | ICD-10-CM | POA: Insufficient documentation

## 2018-03-10 DIAGNOSIS — M545 Low back pain, unspecified: Secondary | ICD-10-CM | POA: Insufficient documentation

## 2018-06-22 ENCOUNTER — Other Ambulatory Visit: Payer: Self-pay | Admitting: Obstetrics and Gynecology

## 2018-06-22 DIAGNOSIS — N951 Menopausal and female climacteric states: Secondary | ICD-10-CM

## 2018-11-13 ENCOUNTER — Other Ambulatory Visit: Payer: Self-pay | Admitting: Internal Medicine

## 2018-11-13 DIAGNOSIS — Z1231 Encounter for screening mammogram for malignant neoplasm of breast: Secondary | ICD-10-CM

## 2018-12-24 ENCOUNTER — Ambulatory Visit
Admission: RE | Admit: 2018-12-24 | Discharge: 2018-12-24 | Disposition: A | Discharge status: Home / Self Care | Payer: Medicare Other | Source: Ambulatory Visit | Attending: Internal Medicine | Admitting: Internal Medicine

## 2018-12-24 ENCOUNTER — Ambulatory Visit
Admission: RE | Admit: 2018-12-24 | Discharge: 2018-12-24 | Disposition: A | Discharge status: Home / Self Care | Payer: Medicare Other | Source: Ambulatory Visit | Attending: Obstetrics and Gynecology | Admitting: Obstetrics and Gynecology

## 2018-12-24 DIAGNOSIS — N951 Menopausal and female climacteric states: Secondary | ICD-10-CM | POA: Diagnosis present

## 2018-12-24 DIAGNOSIS — M810 Age-related osteoporosis without current pathological fracture: Secondary | ICD-10-CM | POA: Insufficient documentation

## 2018-12-24 DIAGNOSIS — Z1231 Encounter for screening mammogram for malignant neoplasm of breast: Secondary | ICD-10-CM | POA: Insufficient documentation

## 2019-02-12 ENCOUNTER — Encounter: Payer: Self-pay | Admitting: Physician Assistant

## 2019-02-12 NOTE — Progress Notes (Signed)
Office Visit    Patient Name: Sally Wilson Date of Encounter: 02/15/2019  Primary Care Provider:  Patient, No Pcp Per Primary Cardiologist:  Ida Rogue, MD  Chief Complaint    69 yo female with family history of CAD and heart failure, no known personal history of cardiac disease, current tobacco use since the age of 53, IBS, and OA, and who presents to clinic today for evaluation of chest tightness.  Past Medical History    Past Medical History:  Diagnosis Date  . Arthritis   . Current tobacco use   . IBS (irritable bowel syndrome)    Past Surgical History:  Procedure Laterality Date  . BREAST BIOPSY Right 2005?   poss stereotatic bx neg    Allergies  Allergies  Allergen Reactions  . Cephalexin Swelling    Thrush of mouth and genitalia. Swelling Stomach cramping. Thrush of mouth and genitalia. Swelling Stomach cramping. Thrush of mouth and genitalia. Swelling Stomach cramping.  . Codeine     Other reaction(s): Other (See Comments)  . Penicillin G     Other reaction(s): Other (See Comments)    History of Present Illness    69 yo female with reported history of current tobacco use (1pk/day) and family history of heart disease with father having a stent placed and CHF and passing in his 61s from a heart attack. She  presents today for further evaluation of chest tightness.  She was previously followed by [facility] and underwent 2008 echo and stress test as outlined below and without significant findings. She established care with [facility] in 2018 after moving from [place] in 2016. At that time, she was documented as smoking 1pk/day. She did not have a regular exercise program. EKG showed NSR with LAD and T wave abnormality in III, aVF. Given her family history and abnormal EKG, further risk stratification with CAC scoring, carotid ultrasound, echo, and stress testing were discussed at that time. She denied CP, SOB, and any s/sx of heart failure. Tentative plan  was for CT coronary calcium score in 02/2017; however, she was then lost to follow-up.  Since that time, the patient reports that she has had a significant amount of family stress, including family death. She most recently lost her brother in September 2020, at which time she noted a chest discomfort that occurs with both exertion and rest. Each episode typically lasts approximately 15 minutes. She reports that she notices it the most when she is not kept busy with activity. She further describes the chest discomfort as a aching and sometimes TTP. When lying flat, she notes her heart racing and pounding, at which time she notices chest discomfort in the lower substernal area, as well as located along the 5th ICS. She also notes the occasional racing HR and palpitations. She is uncertain if the CP is worse with deeper breathing but does feel as if it improves with leaning forward. She also notes relief of pain with taking her anti-anxiety medication. Other associated sx with her chest discomfort include occasional SOB/DOE. Over the last 3 days, she also has noted associated nausea that has prevented her from eating as she would normally. She feels the nausea worsens with her CP. In addition, on exam today, she is TTP in the upper central epigastric region. She occasionally feels dizzy, presyncope; however, she notes that this is ongoing and not unusual for her as she does have a history of vertigo. She has not recently fallen. No reported LEE, orthopnea, or  abdominal distention. She is still smoking daily and rarely drinks alcohol, most recently drinking 1-2 beers a week or so ago. She does admit to drinking 2 large cups of coffee but intends to cut back on this to see if it improves her symptoms. She does admit to anxiety, especially given the recent family loss; however, she does take her medications and as above does sometime note relief of CP with these. She wonders if her anxiety may be playing a role in her  discomfort.   Home Medications    Prior to Admission medications   Medication Sig Start Date End Date Taking? Authorizing Provider  clonazePAM (KLONOPIN) 1 MG tablet  02/24/15   [provider]  PAXIL CR 25 MG 24 hr tablet  02/15/15   [provider]  traZODone (DESYREL) 100 MG tablet  02/24/15   [provider]    Review of Systems    She denies pnd, orthopnea,v, syncope, edema, weight gain. She reports CP that occurs both at rest and with exertion and described as an aching but unchanged from previous visits. CP TTP today and feels better with leaning forward. She reports some SOB with CP. She also reports some dizziness which is unchanged and not new from her. No LOC. She notes occasional racing HR and palpitations without clear triggers but has had increased stress and anxiety. Over the last three days, she has noticed nausea and reduced appetite.  Physical Exam    VS:  BP 128/78 (BP Location: Left Arm, Patient Position: Sitting, Cuff Size: Normal)   Pulse 71   Ht '5\' 6"'$  (1.676 m)   Wt 130 lb 8 oz (59.2 kg)   SpO2 98%   BMI 21.06 kg/m  , BMI Body mass index is 21.06 kg/m. GEN: Well nourished, well developed, in no acute distress. HEENT: normal. Neck: Supple, no JVD, carotid bruits, or masses. Cardiac: RRR, no murmurs, rubs, or gallops. No clubbing, cyanosis, edema.  Radials/DP/PT 2+ and equal bilaterally.  TTP ~5ICS. Respiratory:  Respirations regular and unlabored, coarser breath sounds but otherwise clear to auscultation bilaterally. GI: Soft, nontender, nondistended, BS + x 4. TTP in upper central epigastric region. MS: no deformity or atrophy. Skin: warm and dry, no rash. Neuro:  Strength and sensation are intact. Psych: Normal affect.  Accessory Clinical Findings    ECG personally reviewed by me today - NSR, 71bpm, incomplete RBBB, LAFB, IVCD, nonspecific ST/T changes.  All other systems reviewed and are otherwise negative except as noted above.-  no acute changes.  Filed Weights   02/15/19 0931  Weight: 130 lb 8 oz (59.2 kg)     Echo 2008 EF of 60%.  Mildly thickened mitral valve and mild MR.  Mild TR with PA systolic pressure of 34 mmHg.  Poorly seen pulmonic valve.  Small pericardial effusion versus fat pad.  Stress test 2008.  EKG at baseline showed NSR with incomplete right bundle branch block with nonspecific ST/T changes.  Estimated EF 65%.  No evidence for either rest or stress induced ischemia.  Mild increased lung uptake.  Recommendation to check echo for LV systolic and diastolic function.  Labs 08/2018 ([facility]) CBC: WBC 6.5, RBC 4.55, hemoglobin 14.5, HCT 43.1 BMET: Glucose 99, sodium 141, potassium 4.2, CO2 29.7, creatinine 0.9, BUN 11, AST 14, ALT 16, alk phos 69, albumin 4.2 Labs 5/20 Lipid: Total 168, TG 100, HDL 59.5, LDL 89 TSH 3.729  Assessment & Plan    Chest pain with family history of CAD/MI --  Not current.  Reports ongoing CP as described above in HPI.  Described as aching and occasionally TTP.  Occurs with both exertion and rest.  Some SOB at times. Associated nausea and reduced appetite within the last 3 days. Also reports racing HR and palpitations. CP has both typical and atypical features. Etiologies considered include MSK as TTP, pericarditis as CP improves with leaning forward (though no changes on EKG suggestive of pericarditis), and Takotsubo CM given recent loss of family. Risk factors for CAD include family history of heart disease and current history of smoking.  No acute ST/T changes on EKG. Most recent labs stable and lipids relatively controlled.  Previous 2008 echo showed EF 60% PASP 30 mmHg and 2--8 stress test low risk.   --Plan: Repeat echocardiogram.  Due to report of racing heart rate and palpitations.  We will also plan for ZIO monitor.  If EF reduced on echo and Zio without significant ectopy and arrhythmia, plan for further ischemic work-up with stress test +/- catheteriztion at that  time. Smoking cessation advised. Will also check her BP at follow-up given borderline today.   Racing Heart Rate, Palpitations --Reports racing heart rate that worsens when laying down or at the end of the day when trying to fall asleep.  Occasional palpitations also reported.  Reports a history of vertigo, unchanged for many years. No recent syncope. Most recent labs show stable potassium. TSH nl. Heart rate today in office 71bpm and NSR on EKG. No personal history of arrhythmia. Does admit to recent increased stress and anxiety. --Plan: 2 week ZIO-XT monitor to rule out arrhythmia / quantify burden of ectopy with further recommendations at that time. Obtain echo as above.  Presyncope --Not currently dizzy and BP not soft / not hypotensive on exam today. Reports dizziness at time and unchanged for several years with a history of vertigo. No recent syncope / LOC.  If dizziness continues or worsens, consider carotids at follow-up though no amaurosis fugax or carotid bruits heard today on exam. Also check echo as above.  Nausea without Emesis, h/o IBS --Upper epigastric region TTP today on exam. She reports nausea over the last 3 days with reduced appetite and will continue to monitor. No recent emesis.  On review of records, she does have a history of IBS.   Current smoker --Smoking cessation advised. Consider as also contributing to her SOB /CP. Reports recent CXR by PCP without acute findings. Offered assistance with smoking if needed in the future.    Disposition: Obtain echo, 2 week ZioXT. Follow-up in 6 weeks or sooner if worsening sx or change in sx.  Arvil Chaco, PA-C 02/15/2019, 10:07 AM

## 2019-02-15 ENCOUNTER — Ambulatory Visit: Payer: Medicare Other

## 2019-02-15 ENCOUNTER — Ambulatory Visit (INDEPENDENT_AMBULATORY_CARE_PROVIDER_SITE_OTHER): Payer: Medicare Other | Admitting: Physician Assistant

## 2019-02-15 ENCOUNTER — Other Ambulatory Visit: Payer: Self-pay

## 2019-02-15 ENCOUNTER — Encounter: Payer: Self-pay | Admitting: Physician Assistant

## 2019-02-15 VITALS — BP 128/78 | HR 71 | Ht 66.0 in | Wt 130.5 lb

## 2019-02-15 DIAGNOSIS — R000 Tachycardia, unspecified: Secondary | ICD-10-CM

## 2019-02-15 DIAGNOSIS — R42 Dizziness and giddiness: Secondary | ICD-10-CM

## 2019-02-15 DIAGNOSIS — Z8249 Family history of ischemic heart disease and other diseases of the circulatory system: Secondary | ICD-10-CM

## 2019-02-15 DIAGNOSIS — R002 Palpitations: Secondary | ICD-10-CM

## 2019-02-15 DIAGNOSIS — Z8719 Personal history of other diseases of the digestive system: Secondary | ICD-10-CM

## 2019-02-15 DIAGNOSIS — F17200 Nicotine dependence, unspecified, uncomplicated: Secondary | ICD-10-CM

## 2019-02-15 DIAGNOSIS — R0600 Dyspnea, unspecified: Secondary | ICD-10-CM

## 2019-02-15 DIAGNOSIS — R079 Chest pain, unspecified: Secondary | ICD-10-CM | POA: Diagnosis not present

## 2019-02-15 NOTE — Patient Instructions (Signed)
Medication Instructions:  Your physician recommends that you continue on your current medications as directed. Please refer to the Current Medication list given to you today.  *If you need a refill on your cardiac medications before your next appointment, please call your pharmacy*  Lab Work: None ordered  If you have labs (blood work) drawn today and your tests are completely normal, you will receive your results only by: Marland Kitchen MyChart Message (if you have MyChart) OR . A paper copy in the mail If you have any lab test that is abnormal or we need to change your treatment, we will call you to review the results.  Testing/Procedures: 1- A zio monitor was placed today. It will remain on for 14 days. You will then return monitor and event diary in provided box. It takes 1-2 weeks for report to be downloaded and returned to [place]. We will call you with the results. If monitor falls of or has orange flashing light, please call Zio for further instructions.   2- Echo  Please return to [facility] on ______________ at _______________ AM/PM for an Echocardiogram. Your physician has requested that you have an echocardiogram. Echocardiography is a painless test that uses sound waves to create images of your heart. It provides your doctor with information about the size and shape of your heart and how well your heart's chambers and valves are working. This procedure takes approximately one hour. There are no restrictions for this procedure. Please note; depending on visual quality an IV may need to be placed.    Follow-Up: At [facility], you and your health needs are our priority.  As part of our continuing mission to provide you with exceptional heart care, we have created designated Provider Care Teams.  These Care Teams include your primary Cardiologist (physician) and Advanced Practice Providers (APPs -  Physician Assistants and Nurse Practitioners) who all work together to provide you with  the care you need, when you need it.  Your next appointment:   6 week(s)  The format for your next appointment:   In Person  Provider:    You may see Ida Rogue, MD or Marrianne Mood, PA-C.

## 2019-02-26 ENCOUNTER — Telehealth: Payer: Self-pay | Admitting: Cardiovascular Disease

## 2019-02-26 NOTE — Telephone Encounter (Signed)
Patient asks if she can take her monitor off tomorrow. States she has something going on at her church on Sunday, and would rather not have it on. Please call to discuss.

## 2019-02-26 NOTE — Telephone Encounter (Signed)
The patient had her monitor ordered and placed on 02/15/19 at her OV with Marrianne Mood, PA.  Her 2 week mark will be on 1/18.  Will forward to Jacquelyn to advise if she is ok with the patient removing this prior to her going to church on Sunday.

## 2019-03-05 ENCOUNTER — Other Ambulatory Visit: Payer: Self-pay

## 2019-03-05 ENCOUNTER — Ambulatory Visit (INDEPENDENT_AMBULATORY_CARE_PROVIDER_SITE_OTHER): Payer: Medicare Other

## 2019-03-05 DIAGNOSIS — R0600 Dyspnea, unspecified: Secondary | ICD-10-CM | POA: Diagnosis not present

## 2019-03-05 DIAGNOSIS — R002 Palpitations: Secondary | ICD-10-CM | POA: Insufficient documentation

## 2019-03-05 DIAGNOSIS — M810 Age-related osteoporosis without current pathological fracture: Secondary | ICD-10-CM | POA: Insufficient documentation

## 2019-03-05 HISTORY — DX: Age-related osteoporosis without current pathological fracture: M81.0

## 2019-03-08 ENCOUNTER — Telehealth: Payer: Self-pay

## 2019-03-08 NOTE — Telephone Encounter (Signed)
Spoke to patient regarding echo results and POC from Elenor Quinones, PA.   Pt verbalized understanding and had no further questions at this time.   Confirmed upcoming appt with Dr. Rockey Situ.   No further orders at this time. She will call if sx persist or new sx occur.   Advised pt to call for any further questions or concerns.

## 2019-03-08 NOTE — Telephone Encounter (Signed)
-----   Message from Arvil Chaco, PA-C sent at 03/06/2019  5:25 PM EST ----- Please let Sally Wilson know the pump function of her heart is normal to slightly above normal and relatively unchanged from previous 2008 echo at 60-65% (2008 EF 60%, [place]). The walls of her heart are of normal thickness, and the walls are moving normally.  She has a mildly leaky mitral and tricuspid valve, which is consistent with her 2008 study. The aortic valve was not well visualized, and the echo pictures could not exclude a 2 cusp valve (versus three); although, 2 cusps were not noted in 2008. She showed improved pulmonary artery pressure (previously 52mmHg, now 27.61mmHg). Her inferior vena cava was noted to be dilated in size with right atrial pressure estimate 10-76mmHg, which is on the higher side. Continue to recommend smoking cessation. We will also probably want to keep a closer eye on her volume status. Overall, however, these are reassuring results. With continued chest pain, we can consider a cardiac CT. We are still waiting on the results of her Zio monitor  as well with updates to be provided once we have those results.

## 2019-03-23 ENCOUNTER — Telehealth: Payer: Self-pay

## 2019-03-23 NOTE — Telephone Encounter (Signed)
Call patient for update on monitor results. Pt verbalized understanding and had no questions.   No further orders at this time. Confirmed pt appt later next week.   Advised pt to call for any further questions or concerns.

## 2019-03-23 NOTE — Telephone Encounter (Signed)
-----   Message from Arvil Chaco, PA-C sent at 03/22/2019  3:23 PM EST ----- Please let Sally Wilson know that her monitor was without significant abnormal heart rhythms / arrhythmias. Her monitor showed predominantly normal sinus rhythm with an average of 81 bpm.  She had 6 brief runs of a faster heart rhythm from the top part of her heart - the fastest of which lasted 5 beats (max 148 bpm) and the longest of which lasted 13 beats (avg 117 bpm).  She also had rare extra beats from the bottom part of her heart, which at times correspond with or near extra beats from the bottom part of her heart (sometimes every other beat or every third beat) and are likely the occasional palpitations she reported at the time of our office visit.   Overall, a very reassuring study (along with a reassuring echo). With continued or worsening chest pain / palpitations / dizziness, recommend at her next appointment she discuss starting a medication (either a low dose beta blocker or calcium channel blocker) that may relieve these symptoms versus further workup with previously noted cardiac CT +/- carotid study for her dizziness (discussed by primary cardiologist in the past). Again, this can be discussed at her upcoming appointment on 2/15, or sooner if needed and based on her symptoms. Smoking cessation advised. Again, overall very reassuring findings!

## 2019-03-27 NOTE — Progress Notes (Signed)
Cardiology Office Note  Date:  03/29/2019   ID:  Nela Gander, DOB [birth year], MRN [id number]  PCP:  Idelle Crouch, MD   Chief Complaint  Patient presents with  . office visit    6 week F/U after echo and wearing ZIO monitor; Meds verbally reviewed with patient.    HPI:  Ms. Luma is a 69 year old woman with history of Smoking since age 71 No major cardiac issues per the patient Moved from [place] 2 years, previously followed by cardiology in [place] Husband died of PE No diabetes No HTN No high cholersterol per the patient Who presents for f/u of chest pain  Seen in January 2021 for chest pain Lost her brother and mother September 2020 Significant stress, adjustment Prior cardiology note indicated relief of pain with antianxiety medication  Echocardiogram March 05, 2019, reviewed in detail Normal LV function, RV function, normal left atrial size, normal right heart pressures  ZIO monitor , results reviewed with triggered events not associated with significant arrhythmia Rare episodes of SVT longest 13 beats 117 bpm  Continues to smoke 1 pack/day No regular exercise program No significant chest pain on exertion, No significant lower extremity edema, PND, shortness of breath  EKG personally reviewed by myself on todays visit Shows normal sinus rhythm with rate 79 bpm no significant ST-T wave changes, PVCs noted  Family hx  Father had stent, CHF   PMH:   has a past medical history of Arthritis, Current tobacco use, and IBS (irritable bowel syndrome).  PSH:    Past Surgical History:  Procedure Laterality Date  . BREAST BIOPSY Right 2005?   poss stereotatic bx neg  . TUBAL LIGATION      Current Outpatient Medications  Medication Sig Dispense Refill  . clonazePAM (KLONOPIN) 1 MG tablet Take 1 mg by mouth daily.   0  . PAXIL CR 25 MG 24 hr tablet Take 25 mg by mouth daily.   0  . traZODone (DESYREL) 100 MG tablet Take 100 mg by mouth at bedtime.   0    Current Facility-Administered Medications  Medication Dose Route Frequency Provider Last Rate Last Admin  . betamethasone acetate-betamethasone sodium phosphate (CELESTONE) injection 3 mg  3 mg Intramuscular Once Edrick Kins, DPM         Allergies:   Cephalexin, Codeine, and Penicillin g   Social History:  The patient  reports that she has been smoking. She has never used smokeless tobacco. She reports that she does not drink alcohol or use drugs.   Family History:   family history includes Breast cancer in her maternal aunt and maternal aunt.    Review of Systems: Review of Systems  Constitutional: Negative.   HENT: Negative.   Respiratory: Negative.   Cardiovascular: Negative.   Gastrointestinal: Negative.   Musculoskeletal: Negative.   Neurological: Negative.   Psychiatric/Behavioral: Negative.   All other systems reviewed and are negative.   PHYSICAL EXAM: VS:  BP 110/70 (BP Location: Left Arm, Patient Position: Sitting, Cuff Size: Normal)   Pulse 79   Ht 5' 6.5" (1.689 m)   Wt 130 lb 12 oz (59.3 kg)   SpO2 99%   BMI 20.79 kg/m  , BMI Body mass index is 20.79 kg/m. GEN: Thin,  well developed, in no acute distress  HEENT: normal  Neck: no JVD, carotid bruits, or masses Cardiac: RRR; no murmurs, rubs, or gallops,no edema  Respiratory:  clear to auscultation bilaterally, normal work of breathing GI: soft, nontender, nondistended, +  BS MS: no deformity or atrophy  Skin: warm and dry, no rash Neuro:  Strength and sensation are intact Psych: euthymic mood, full affect   Recent Labs: No results found for requested labs within last 8760 hours.    Lipid Panel No results found for: CHOL, HDL, LDLCALC, TRIG    Wt Readings from Last 3 Encounters:  03/29/19 130 lb 12 oz (59.3 kg)  02/15/19 130 lb 8 oz (59.2 kg)  12/10/16 126 lb 8 oz (57.4 kg)       ASSESSMENT AND PLAN:  Smoker - We have encouraged her to continue to work on weaning her cigarettes and  smoking cessation. She will continue to work on this and does not want any assistance with chantix.   Chest pain Atypical in nature, recent echo essentially normal Recommended CT coronary calcium scoring She may benefit from CT chest through primary care screening for history of smoking, lung cancer screening She will talk with Dr. Doy Hutching  PVCs Asymptomatic, discussed in detail, no further work-up needed, she does not have symptoms warranting medication  Disposition:   F/U as needed   Total encounter time more than 45 minutes  Greater than 50% was spent in counseling and coordination of care with the patient    No orders of the defined types were placed in this encounter.    Signed, Esmond Plants, M.D., Ph.D. 03/29/2019  [place], [place]

## 2019-03-29 ENCOUNTER — Ambulatory Visit (INDEPENDENT_AMBULATORY_CARE_PROVIDER_SITE_OTHER): Payer: Medicare Other | Admitting: Cardiovascular Disease

## 2019-03-29 ENCOUNTER — Encounter: Payer: Self-pay | Admitting: Cardiovascular Disease

## 2019-03-29 ENCOUNTER — Other Ambulatory Visit: Payer: Self-pay

## 2019-03-29 VITALS — BP 110/70 | HR 79 | Ht 66.5 in | Wt 130.8 lb

## 2019-03-29 DIAGNOSIS — F17200 Nicotine dependence, unspecified, uncomplicated: Secondary | ICD-10-CM | POA: Diagnosis not present

## 2019-03-29 DIAGNOSIS — R002 Palpitations: Secondary | ICD-10-CM

## 2019-03-29 DIAGNOSIS — R0600 Dyspnea, unspecified: Secondary | ICD-10-CM | POA: Diagnosis not present

## 2019-03-29 DIAGNOSIS — R079 Chest pain, unspecified: Secondary | ICD-10-CM | POA: Diagnosis not present

## 2019-03-29 NOTE — Patient Instructions (Addendum)
Please call if you would like a CT coronary calcium score   Medication Instructions:  No changes  If you need a refill on your cardiac medications before your next appointment, please call your pharmacy.    Lab work: No new labs needed   If you have labs (blood work) drawn today and your tests are completely normal, you will receive your results only by: Marland Kitchen MyChart Message (if you have MyChart) OR . A paper copy in the mail If you have any lab test that is abnormal or we need to change your treatment, we will call you to review the results.   Testing/Procedures:  CT coronary calcium score $150   Please call [phone] to schedule     [facility]  [address], [place] [address]   Follow-Up: At [facility], you and your health needs are our priority.  As part of our continuing mission to provide you with exceptional heart care, we have created designated Provider Care Teams.  These Care Teams include your primary Cardiologist (physician) and Advanced Practice Providers (APPs -  Physician Assistants and Nurse Practitioners) who all work together to provide you with the care you need, when you need it.  . You will need a follow up appointment as needed  . Providers on your designated Care Team:   . Murray Hodgkins, NP . Christell Faith, PA-C . Marrianne Mood, PA-C  Any Other Special Instructions Will Be Listed Below (If Applicable).  For educational health videos Log in to : [email or web address] Or : [email or web address], password : [email or web address]

## 2019-07-28 ENCOUNTER — Other Ambulatory Visit: Payer: Medicare Other

## 2019-07-30 ENCOUNTER — Ambulatory Visit: Admit: 2019-07-30 | Payer: Medicare Other | Admitting: Ophthalmology

## 2019-07-30 SURGERY — REPAIR, BLEPHAROPTOSIS
Anesthesia: Monitor Anesthesia Care | Laterality: Bilateral

## 2019-09-02 ENCOUNTER — Other Ambulatory Visit: Payer: Self-pay | Admitting: Internal Medicine

## 2019-09-02 DIAGNOSIS — Z1231 Encounter for screening mammogram for malignant neoplasm of breast: Secondary | ICD-10-CM

## 2019-09-15 ENCOUNTER — Other Ambulatory Visit: Payer: Self-pay

## 2019-09-15 ENCOUNTER — Telehealth (INDEPENDENT_AMBULATORY_CARE_PROVIDER_SITE_OTHER): Payer: Self-pay | Admitting: Gastroenterology

## 2019-09-15 DIAGNOSIS — Z1211 Encounter for screening for malignant neoplasm of colon: Secondary | ICD-10-CM

## 2019-09-15 NOTE — Progress Notes (Signed)
Gastroenterology Pre-Procedure Review  Request Date: Not Scheduled Due to [place] Requesting Physician: Dr.   PATIENT REVIEW QUESTIONS: The patient responded to the following health history questions as indicated:    1. Are you having any GI issues? no 2. Do you have a personal history of Polyps? yes (patient states she usually has polpys on her colonoscopies.  She has them done every 3 years or so due to mother and brother had colon cancer) 3. Do you have a family history of Colon Cancer or Polyps? yes (mother and brother) 18. Diabetes Mellitus? no 5. Joint replacements in the past 12 months?no 6. Major health problems in the past 3 months?no 7. Any artificial heart valves, MVP, or defibrillator?no    MEDICATIONS & ALLERGIES:    Patient reports the following regarding taking any anticoagulation/antiplatelet therapy:   Plavix, Coumadin, Eliquis, Xarelto, Lovenox, Pradaxa, Brilinta, or Effient? no Aspirin? no  Patient confirms/reports the following medications:  Current Outpatient Medications  Medication Sig Dispense Refill  . clonazePAM (KLONOPIN) 1 MG tablet Take 1 mg by mouth daily.   0  . PAXIL CR 25 MG 24 hr tablet Take 25 mg by mouth daily.   0  . traZODone (DESYREL) 100 MG tablet Take 100 mg by mouth at bedtime.   0   Current Facility-Administered Medications  Medication Dose Route Frequency Provider Last Rate Last Admin  . betamethasone acetate-betamethasone sodium phosphate (CELESTONE) injection 3 mg  3 mg Intramuscular Once Edrick Kins, DPM        Patient confirms/reports the following allergies:  Allergies  Allergen Reactions  . Cephalexin Swelling    Thrush of mouth and genitalia. Swelling Stomach cramping. Thrush of mouth and genitalia. Swelling Stomach cramping. Thrush of mouth and genitalia. Swelling Stomach cramping.  . Codeine     Other reaction(s): Other (See Comments)  . Penicillin G     Other reaction(s): Other (See Comments)    No  orders of the defined types were placed in this encounter.   AUTHORIZATION INFORMATION Primary Insurance: 1D#: Group #:  Secondary Insurance: 1D#: Group #:  SCHEDULE INFORMATION: Date: Not Scheduled Due to Mandatory COVID Testing Time: Location:

## 2019-12-28 ENCOUNTER — Other Ambulatory Visit: Payer: Self-pay

## 2019-12-28 ENCOUNTER — Emergency Department: Payer: Medicare Other

## 2019-12-28 ENCOUNTER — Emergency Department
Admission: EM | Admit: 2019-12-28 | Discharge: 2019-12-28 | Disposition: A | Discharge status: Home / Self Care | Payer: Medicare Other | Attending: Emergency Medicine | Admitting: Emergency Medicine

## 2019-12-28 DIAGNOSIS — S0990XA Unspecified injury of head, initial encounter: Secondary | ICD-10-CM | POA: Diagnosis not present

## 2019-12-28 DIAGNOSIS — F17210 Nicotine dependence, cigarettes, uncomplicated: Secondary | ICD-10-CM | POA: Insufficient documentation

## 2019-12-28 DIAGNOSIS — M542 Cervicalgia: Secondary | ICD-10-CM | POA: Insufficient documentation

## 2019-12-28 DIAGNOSIS — S60221A Contusion of right hand, initial encounter: Secondary | ICD-10-CM | POA: Diagnosis not present

## 2019-12-28 DIAGNOSIS — Y92410 Unspecified street and highway as the place of occurrence of the external cause: Secondary | ICD-10-CM | POA: Insufficient documentation

## 2019-12-28 DIAGNOSIS — S60222A Contusion of left hand, initial encounter: Secondary | ICD-10-CM | POA: Insufficient documentation

## 2019-12-28 NOTE — Discharge Instructions (Signed)
Continue to take Tylenol at home for pain.
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
## 2020-05-24 ENCOUNTER — Other Ambulatory Visit: Payer: Self-pay

## 2020-05-24 ENCOUNTER — Telehealth (INDEPENDENT_AMBULATORY_CARE_PROVIDER_SITE_OTHER): Payer: Self-pay | Admitting: Gastroenterology

## 2020-05-24 DIAGNOSIS — Z1211 Encounter for screening for malignant neoplasm of colon: Secondary | ICD-10-CM

## 2020-05-24 MED ORDER — NA SULFATE-K SULFATE-MG SULF 17.5-3.13-1.6 GM/177ML PO SOLN: 1.0000 | Freq: Once | ORAL | 0 refills | Status: AC

## 2020-05-24 NOTE — Progress Notes (Signed)
Gastroenterology Pre-Procedure Review  Request Date: 07/18/20 Requesting Physician: Dr. Allen Norris  PATIENT REVIEW QUESTIONS: The patient responded to the following health history questions as indicated:    1. Are you having any GI issues? no 2. Do you have a personal history of Polyps? yes (pt is unsure of the date of her last colonoscopy. ) 3. Do you have a family history of Colon Cancer or Polyps? yes (brother and mother colon cancer) 4. Diabetes Mellitus? no 5. Joint replacements in the past 12 months?no 6. Major health problems in the past 3 months?no 7. Any artificial heart valves, MVP, or defibrillator?no    MEDICATIONS & ALLERGIES:    Patient reports the following regarding taking any anticoagulation/antiplatelet therapy:   Plavix, Coumadin, Eliquis, Xarelto, Lovenox, Pradaxa, Brilinta, or Effient? no Aspirin? no  Patient confirms/reports the following medications:  Current Outpatient Medications  Medication Sig Dispense Refill  . Na Sulfate-K Sulfate-Mg Sulf 17.5-3.13-1.6 GM/177ML SOLN Take 1 kit by mouth once for 1 dose. 354 mL 0  . clonazePAM (KLONOPIN) 1 MG tablet Take 1 mg by mouth daily.   0  . PAXIL CR 25 MG 24 hr tablet Take 25 mg by mouth daily.   0  . traZODone (DESYREL) 100 MG tablet Take 100 mg by mouth at bedtime.   0   Current Facility-Administered Medications  Medication Dose Route Frequency Provider Last Rate Last Admin  . betamethasone acetate-betamethasone sodium phosphate (CELESTONE) injection 3 mg  3 mg Intramuscular Once Edrick Kins, DPM        Patient confirms/reports the following allergies:  Allergies  Allergen Reactions  . Cephalexin Swelling    Thrush of mouth and genitalia. Swelling Stomach cramping. Thrush of mouth and genitalia. Swelling Stomach cramping. Thrush of mouth and genitalia. Swelling Stomach cramping.  . Codeine     Other reaction(s): Other (See Comments)  . Penicillin G     Other reaction(s): Other (See Comments)    No  orders of the defined types were placed in this encounter.   AUTHORIZATION INFORMATION Primary Insurance: 1D#: Group #:  Secondary Insurance: 1D#: Group #:  SCHEDULE INFORMATION: Date: 07/18/20 Time: Location:[facility]

## 2020-08-25 SURGERY — Surgical Case
Anesthesia: *Unknown

## 2020-09-27 ENCOUNTER — Telehealth (INDEPENDENT_AMBULATORY_CARE_PROVIDER_SITE_OTHER): Payer: Self-pay | Admitting: Gastroenterology

## 2020-09-27 DIAGNOSIS — Z1211 Encounter for screening for malignant neoplasm of colon: Secondary | ICD-10-CM

## 2020-09-27 DIAGNOSIS — Z86010 Personal history of colonic polyps: Secondary | ICD-10-CM

## 2020-09-27 MED ORDER — SUTAB 1479-225-188 MG PO TABS: 1.0000 | ORAL_TABLET | Freq: Once | ORAL | 0 refills | Status: AC

## 2020-09-27 NOTE — Progress Notes (Signed)
Gastroenterology Pre-Procedure Review  RequeST DATE; 11/02/2020 Requesting Physician: Dr. Allen Norris  PATIENT REVIEW QUESTIONS: The patient responded to the following health history questions as indicated:    1. Are you having any GI issues? no 2. Do you have a personal history of Polyps? yes (SOME REMOVED 4 YEARS AGO) 3. Do you have a family history of Colon Cancer or Polyps? yes (MOTHER AND BROTHER COLON CANCER) 4. Diabetes Mellitus? no 5. Joint replacements in the past 12 months?no 6. Major health problems in the past 3 months?no 7. Any artificial heart valves, MVP, or defibrillator?no    MEDICATIONS & ALLERGIES:    Patient reports the following regarding taking any anticoagulation/antiplatelet therapy:   Plavix, Coumadin, Eliquis, Xarelto, Lovenox, Pradaxa, Brilinta, or Effient? no Aspirin? no  Patient confirms/reports the following medications:  Current Outpatient Medications  Medication Sig Dispense Refill   clonazePAM (KLONOPIN) 1 MG tablet Take 1 mg by mouth daily.   0   PAXIL CR 25 MG 24 hr tablet Take 25 mg by mouth daily.   0   Sodium Sulfate-Mag Sulfate-KCl (SUTAB) [phone] MG TABS Take 1 kit by mouth once for 1 dose. 24 tablet 0   traZODone (DESYREL) 100 MG tablet Take 100 mg by mouth at bedtime.   0   chlorhexidine (PERIDEX) 0.12 % solution SMARTSIG:By Mouth     Current Facility-Administered Medications  Medication Dose Route Frequency Provider Last Rate Last Admin   betamethasone acetate-betamethasone sodium phosphate (CELESTONE) injection 3 mg  3 mg Intramuscular Once Edrick Kins, DPM        Patient confirms/reports the following allergies:  Allergies  Allergen Reactions   Cephalexin Swelling    Thrush of mouth and genitalia. Swelling Stomach cramping. Thrush of mouth and genitalia. Swelling Stomach cramping. Thrush of mouth and genitalia. Swelling Stomach cramping.   Codeine     Other reaction(s): Other (See Comments)   Penicillin G     Other  reaction(s): Other (See Comments)    No orders of the defined types were placed in this encounter.   AUTHORIZATION INFORMATION Primary Insurance: 1D#: Group #:  Secondary Insurance: 1D#: Group #:  SCHEDULE INFORMATION: Date: 11/02/2020 Time: Location: [place]

## 2020-10-27 ENCOUNTER — Ambulatory Visit: Admit: 2020-10-27 | Payer: No Typology Code available for payment source | Admitting: Ophthalmology

## 2020-10-27 SURGERY — BLEPHAROPLASTY
Anesthesia: Monitor Anesthesia Care | Laterality: Left

## 2020-11-02 ENCOUNTER — Ambulatory Visit: Payer: Medicare Other | Admitting: Anesthesiology

## 2020-11-02 ENCOUNTER — Encounter
Admission: RE | Disposition: A | Discharge status: Home / Self Care | Payer: Self-pay | Source: Ambulatory Visit | Attending: Gastroenterology

## 2020-11-02 ENCOUNTER — Encounter: Payer: Self-pay | Admitting: Gastroenterology

## 2020-11-02 ENCOUNTER — Ambulatory Visit
Admission: RE | Admit: 2020-11-02 | Discharge: 2020-11-02 | Disposition: A | Discharge status: Home / Self Care | Payer: Medicare Other | Source: Ambulatory Visit | Attending: Gastroenterology | Admitting: Gastroenterology

## 2020-11-02 DIAGNOSIS — F17210 Nicotine dependence, cigarettes, uncomplicated: Secondary | ICD-10-CM | POA: Diagnosis not present

## 2020-11-02 DIAGNOSIS — D125 Benign neoplasm of sigmoid colon: Secondary | ICD-10-CM | POA: Diagnosis not present

## 2020-11-02 DIAGNOSIS — Z1211 Encounter for screening for malignant neoplasm of colon: Secondary | ICD-10-CM | POA: Diagnosis not present

## 2020-11-02 DIAGNOSIS — D122 Benign neoplasm of ascending colon: Secondary | ICD-10-CM | POA: Diagnosis not present

## 2020-11-02 DIAGNOSIS — D120 Benign neoplasm of cecum: Secondary | ICD-10-CM | POA: Insufficient documentation

## 2020-11-02 DIAGNOSIS — K635 Polyp of colon: Secondary | ICD-10-CM

## 2020-11-02 DIAGNOSIS — K5730 Diverticulosis of large intestine without perforation or abscess without bleeding: Secondary | ICD-10-CM | POA: Insufficient documentation

## 2020-11-02 DIAGNOSIS — K589 Irritable bowel syndrome without diarrhea: Secondary | ICD-10-CM | POA: Diagnosis not present

## 2020-11-02 DIAGNOSIS — K6389 Other specified diseases of intestine: Secondary | ICD-10-CM | POA: Insufficient documentation

## 2020-11-02 DIAGNOSIS — Z860100 Personal history of colon polyps, unspecified: Secondary | ICD-10-CM

## 2020-11-02 DIAGNOSIS — K648 Other hemorrhoids: Secondary | ICD-10-CM | POA: Insufficient documentation

## 2020-11-02 HISTORY — PX: COLONOSCOPY WITH PROPOFOL: SHX5780

## 2020-11-02 SURGERY — COLONOSCOPY WITH PROPOFOL
Anesthesia: General

## 2020-11-02 MED ORDER — SODIUM CHLORIDE 0.9 % IV SOLN
INTRAVENOUS | Status: DC
  Administered 2020-11-02: 1000 mL via INTRAVENOUS

## 2020-11-02 MED ORDER — PROPOFOL 10 MG/ML IV BOLUS
INTRAVENOUS | Status: DC | PRN
  Administered 2020-11-02: 100 mg via INTRAVENOUS

## 2020-11-02 MED ORDER — PROPOFOL 500 MG/50ML IV EMUL
INTRAVENOUS | Status: DC | PRN
  Administered 2020-11-02: 125 ug/kg/min via INTRAVENOUS

## 2020-11-02 MED ORDER — LIDOCAINE HCL (CARDIAC) PF 100 MG/5ML IV SOSY
PREFILLED_SYRINGE | INTRAVENOUS | Status: DC | PRN
  Administered 2020-11-02: 50 mg via INTRAVENOUS

## 2020-11-02 MED ORDER — PROPOFOL 500 MG/50ML IV EMUL
INTRAVENOUS | Status: AC
  Filled 2020-11-02: qty 50

## 2020-11-02 NOTE — Anesthesia Preprocedure Evaluation (Addendum)
Anesthesia Evaluation  Patient identified by MRN, date of birth, ID band Patient awake    Reviewed: Allergy & Precautions, NPO status , Patient's Chart, lab work & pertinent test results  History of Anesthesia Complications Negative for: history of anesthetic complications  Airway Mallampati: II  TM Distance: >3 FB Neck ROM: Full    Dental   Pulmonary Current Smoker and Patient abstained from smoking.,    Pulmonary exam normal        Cardiovascular negative cardio ROS Normal cardiovascular exam     Neuro/Psych negative neurological ROS  negative psych ROS   GI/Hepatic negative GI ROS, Neg liver ROS,   Endo/Other  negative endocrine ROS  Renal/GU negative Renal ROS  negative genitourinary   Musculoskeletal  (+) Arthritis ,   Abdominal   Peds negative pediatric ROS (+)  Hematology negative hematology ROS (+)   Anesthesia Other Findings 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 Left ventricular ejection fraction, by visual estimation, is 60 to  65%. The left ventricle has normal function. There is no left ventricular  hypertrophy.  2. Global right ventricle has normal systolic function.The right  ventricular size is normal. No increase in right ventricular wall  thickness.  3. Left atrial size was normal.  4. The aortic valve not well visualized. Unable to exclude bicuspid  aortic valve.  5. Normal pulmonary artery systolic pressure.  6. The inferior vena cava is dilated in size with <50% respiratory  variability, suggesting right atrial pressure of 15 mmHg.  7. The left ventricle has no regional wall motion abnormalities.   Reproductive/Obstetrics negative OB ROS                            Anesthesia Physical Anesthesia Plan  ASA: 2  Anesthesia Plan: General   Post-op Pain Management:    Induction:   PONV Risk Score and Plan:   Airway Management Planned:  Natural Airway  Additional Equipment:   Intra-op Plan:   Post-operative Plan:   Informed Consent: I have reviewed the patients History and Physical, chart, labs and discussed the procedure including the risks, benefits and alternatives for the proposed anesthesia with the patient or authorized representative who has indicated his/her understanding and acceptance.       Plan Discussed with: CRNA  Anesthesia Plan Comments:         Anesthesia Quick Evaluation

## 2020-11-02 NOTE — Transfer of Care (Signed)
Immediate Anesthesia Transfer of Care Note  Patient: SPARKLES MCNEELY  Procedure(s) Performed: COLONOSCOPY WITH PROPOFOL  Patient Location: PACU  Anesthesia Type:MAC  Level of Consciousness: awake, alert  and oriented  Airway & Oxygen Therapy: Patient Spontanous Breathing and Patient connected to nasal cannula oxygen  Post-op Assessment: Report given to RN and Post -op Vital signs reviewed and stable  Post vital signs: stable  Last Vitals:  Vitals Value Taken Time  BP 89/56 11/02/20 0904  Temp 36.4 C 11/02/20 0903  Pulse 72 11/02/20 0905  Resp 17 11/02/20 0905  SpO2 97 % 11/02/20 0905  Vitals shown include unvalidated device data.  Last Pain:  Vitals:   11/02/20 0903  TempSrc: Temporal  PainSc: Asleep         Complications: No notable events documented.

## 2020-11-02 NOTE — Op Note (Signed)
[facility] Gastroenterology Patient Name: Sally Wilson Procedure Date: 11/02/2020 8:40 AM MRN: [id number] Account #: [id number] Date of Birth: Sep 23, 1950 Admit Type: Outpatient Age: 70 Room: [facility] ENDO ROOM 4 Gender: Female Note Status: Finalized Instrument Name: Park Meo [id number] Procedure:             Colonoscopy Indications:           High risk colon cancer surveillance: Personal history                         of colonic polyps Providers:             Lucilla Lame MD, MD Referring MD:          Leonie Douglas. Doy Hutching, MD (Referring MD) Medicines:             Propofol per Anesthesia Complications:         No immediate complications. Procedure:             Pre-Anesthesia Assessment:                        - Prior to the procedure, a History and Physical was                         performed, and patient medications and allergies were                         reviewed. The patient's tolerance of previous                         anesthesia was also reviewed. The risks and benefits                         of the procedure and the sedation options and risks                         were discussed with the patient. All questions were                         answered, and informed consent was obtained. Prior                         Anticoagulants: The patient has taken no previous                         anticoagulant or antiplatelet agents. ASA Grade                         Assessment: II - A patient with mild systemic disease.                         After reviewing the risks and benefits, the patient                         was deemed in satisfactory condition to undergo the                         procedure.  After obtaining informed consent, the colonoscope was                         passed under direct vision. Throughout the procedure,                         the patient's blood pressure, pulse, and oxygen                         saturations  were monitored continuously. The                         Colonoscope was introduced through the anus and                         advanced to the the cecum, identified by appendiceal                         orifice and ileocecal valve. The colonoscopy was                         performed without difficulty. The patient tolerated                         the procedure well. The quality of the bowel                         preparation was excellent. Findings:      The perianal and digital rectal examinations were normal.      Eight sessile polyps were found in the cecum. The polyps were 3 to 8 mm       in size. These polyps were removed with a cold snare. Resection and       retrieval were complete.      A 5 mm polyp was found in the ascending colon. The polyp was sessile.       The polyp was removed with a cold snare. Resection and retrieval were       complete.      Two sessile polyps were found in the transverse colon. The polyps were 4       to 5 mm in size. These polyps were removed with a cold snare. Resection       and retrieval were complete.      A 3 mm polyp was found in the sigmoid colon. The polyp was sessile. The       polyp was removed with a cold biopsy forceps. Resection and retrieval       were complete.      A 6 mm polyp was found in the sigmoid colon. The polyp was sessile. The       polyp was removed with a cold snare. Resection and retrieval were       complete.      Multiple small-mouthed diverticula were found in the sigmoid colon.      Non-bleeding internal hemorrhoids were found during retroflexion. The       hemorrhoids were Grade I (internal hemorrhoids that do not prolapse). Impression:            - Eight 3 to 8 mm polyps in the cecum, removed with a  cold snare. Resected and retrieved.                        - One 5 mm polyp in the ascending colon, removed with                         a cold snare. Resected and retrieved.                         - Two 4 to 5 mm polyps in the transverse colon,                         removed with a cold snare. Resected and retrieved.                        - One 3 mm polyp in the sigmoid colon, removed with a                         cold biopsy forceps. Resected and retrieved.                        - One 6 mm polyp in the sigmoid colon, removed with a                         cold snare. Resected and retrieved.                        - Diverticulosis in the sigmoid colon.                        - Non-bleeding internal hemorrhoids. Recommendation:        - Discharge patient to home.                        - Resume previous diet.                        - Continue present medications.                        - Await pathology results.                        - Repeat colonoscopy in 3 years for surveillance. Procedure Code(s):     --- Professional ---                        [phone], Colonoscopy, flexible; with removal of                         tumor(s), polyp(s), or other lesion(s) by snare                         technique                        [id number], 59, Colonoscopy, flexible; with biopsy, single                         or multiple Diagnosis Code(s):     ---  Professional ---                        Z86.010, Personal history of colonic polyps                        K63.5, Polyp of colon CPT copyright 2019 American Medical Association. All rights reserved. The codes documented in this report are preliminary and upon coder review may  be revised to meet current compliance requirements. Lucilla Lame MD, MD 11/02/2020 9:03:39 AM This report has been signed electronically. Number of Addenda: 0 Note Initiated On: 11/02/2020 8:40 AM Scope Withdrawal Time: 0 hours 10 minutes 11 seconds  Total Procedure Duration: 0 hours 15 minutes 51 seconds  Estimated Blood Loss:  Estimated blood loss: none.      [facility]

## 2020-11-02 NOTE — Anesthesia Postprocedure Evaluation (Signed)
Anesthesia Post Note  Patient: Sally Wilson  Procedure(s) Performed: COLONOSCOPY WITH PROPOFOL  Patient location during evaluation: PACU Anesthesia Type: General Level of consciousness: awake and alert Pain management: pain level controlled Vital Signs Assessment: post-procedure vital signs reviewed and stable Respiratory status: spontaneous breathing, nonlabored ventilation, respiratory function stable and patient connected to nasal cannula oxygen Cardiovascular status: blood pressure returned to baseline and stable Postop Assessment: no apparent nausea or vomiting Anesthetic complications: no   No notable events documented.   Last Vitals:  Vitals:   11/02/20 0903 11/02/20 0923  BP: (!) 89/56 125/65  Pulse: 72   Resp: 16   Temp: (!) 36.4 C   SpO2: 98%     Last Pain:  Vitals:   11/02/20 0923  TempSrc:   PainSc: 0-No pain                 Ziggy Reveles Josph Macho

## 2020-11-02 NOTE — H&P (Signed)
Lucilla Lame, MD [facility] [address], [place], [place] [address] [phone] Fax : [phone]  Primary Care Physician:  Idelle Crouch, MD Primary Gastroenterologist:  Dr. Allen Norris  Pre-Procedure History & Physical: HPI:  Sally Wilson is a 70 y.o. female is here for an colonoscopy.   Past Medical History:  Diagnosis Date   Arthritis    Current tobacco use    IBS (irritable bowel syndrome)     Past Surgical History:  Procedure Laterality Date   BREAST BIOPSY Right 2005?   poss stereotatic bx neg   TUBAL LIGATION      Prior to Admission medications   Medication Sig Start Date End Date Taking? Authorizing Provider  clonazePAM (KLONOPIN) 1 MG tablet Take 1 mg by mouth daily.  02/24/15  Yes [provider]  PAXIL CR 25 MG 24 hr tablet Take 25 mg by mouth daily.  02/15/15  Yes [provider]  traZODone (DESYREL) 100 MG tablet Take 100 mg by mouth at bedtime.  02/24/15  Yes [provider]  chlorhexidine (PERIDEX) 0.12 % solution SMARTSIG:By Mouth 09/24/20   [provider]    Allergies as of 09/27/2020 - Review Complete 09/27/2020  Allergen Reaction Noted   Cephalexin Swelling 08/21/2016   Codeine  03/06/2015   Penicillin g  11/20/2016    Family History  Problem Relation Age of Onset   Breast cancer Maternal Aunt    Breast cancer Maternal Aunt     Social History   Socioeconomic History   Marital status: Widowed    Spouse name: Not on file   Number of children: Not on file   Years of education: Not on file   Highest education level: Not on file  Occupational History   Not on file  Tobacco Use   Smoking status: Some Days   Smokeless tobacco: Never   Tobacco comments:    5 cigarettes a day  Vaping Use   Vaping Use: Never used  Substance and Sexual Activity   Alcohol use: No   Drug use: No   Sexual activity: Not on file  Other Topics Concern   Not on file  Social History Narrative   Not on file    Social Determinants of Health   Financial Resource Strain: Not on file  Food Insecurity: Not on file  Transportation Needs: Not on file  Physical Activity: Not on file  Stress: Not on file  Social Connections: Not on file  Intimate Partner Violence: Not on file    Review of Systems: See HPI, otherwise negative ROS  Physical Exam: BP 109/75   Pulse 90   Temp (!) 96.9 F (36.1 C) (Temporal)   Resp 18   Ht 5\' 6"  (1.676 m)   Wt 59.9 kg   SpO2 100%   BMI 21.31 kg/m  General:   Alert,  pleasant and cooperative in NAD Head:  Normocephalic and atraumatic. Neck:  Supple; no masses or thyromegaly. Lungs:  Clear throughout to auscultation.    Heart:  Regular rate and rhythm. Abdomen:  Soft, nontender and nondistended. Normal bowel sounds, without guarding, and without rebound.   Neurologic:  Alert and  oriented x4;  grossly normal neurologically.  Impression/Plan: Sally Wilson is here for an colonoscopy to be performed for a history of adenomatous polyps on   Risks, benefits, limitations, and alternatives regarding  colonoscopy have been reviewed with the patient.  Questions have been answered.  All parties agreeable.   Lucilla Lame, MD  11/02/2020, 8:37 AM

## 2020-11-03 ENCOUNTER — Encounter: Payer: Self-pay | Admitting: Gastroenterology

## 2020-11-03 LAB — SURGICAL PATHOLOGY

## 2021-01-08 IMAGING — MG DIGITAL SCREENING BILAT W/ TOMO W/ CAD
8 series · 9 of 24 positions shown · non-contrast
Comparison: None.

CLINICAL DATA: Screening.

EXAM:
DIGITAL SCREENING BILATERAL MAMMOGRAM WITH TOMO AND CAD

[L CC synth-2D]
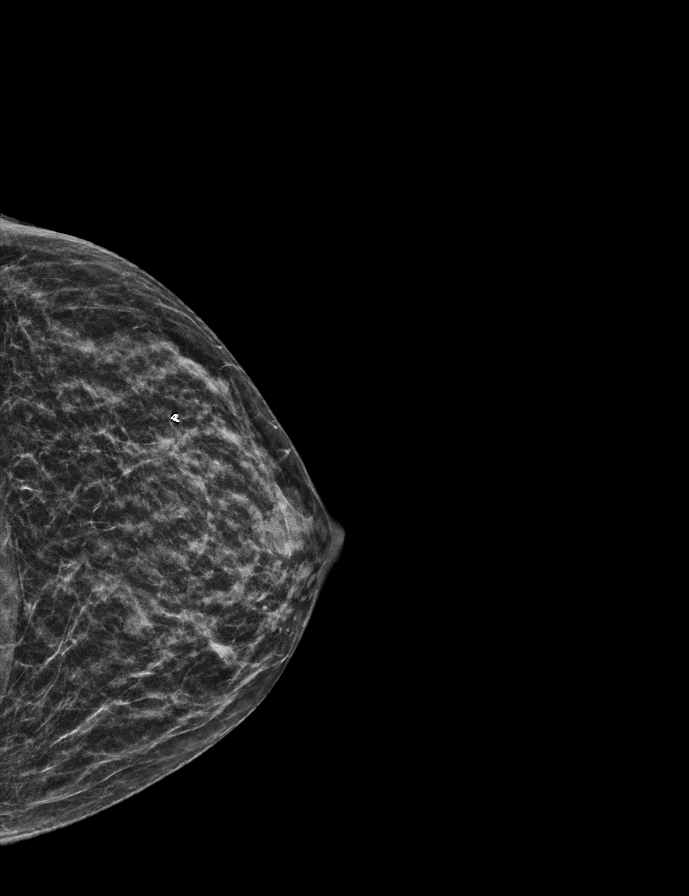

[L MLO synth-2D]
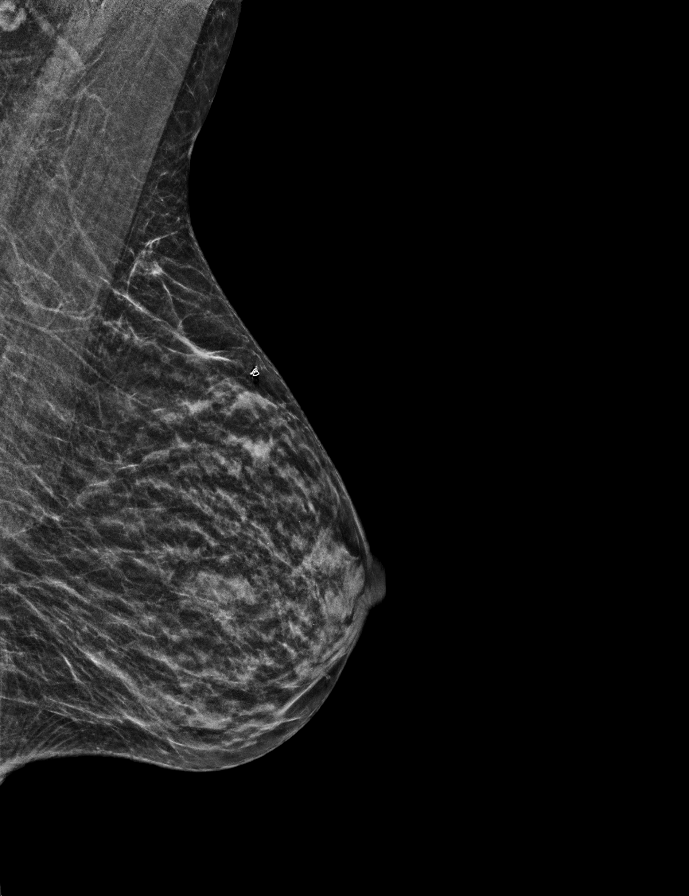

[R MLO synth-2D]
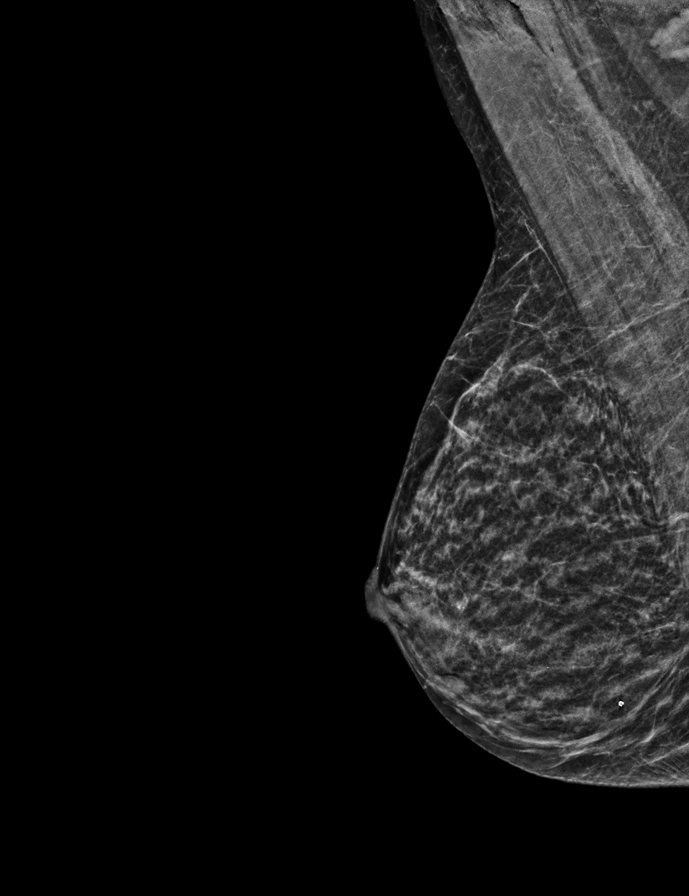

[R CC synth-2D]
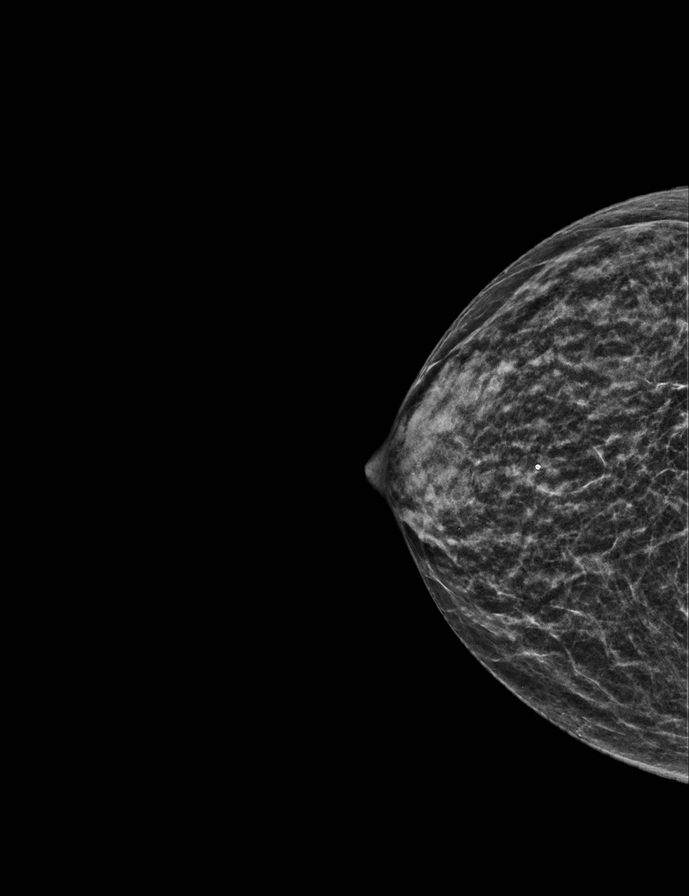

[R CC tomo · 2 of 33 frames shown]
[frame 11/33]
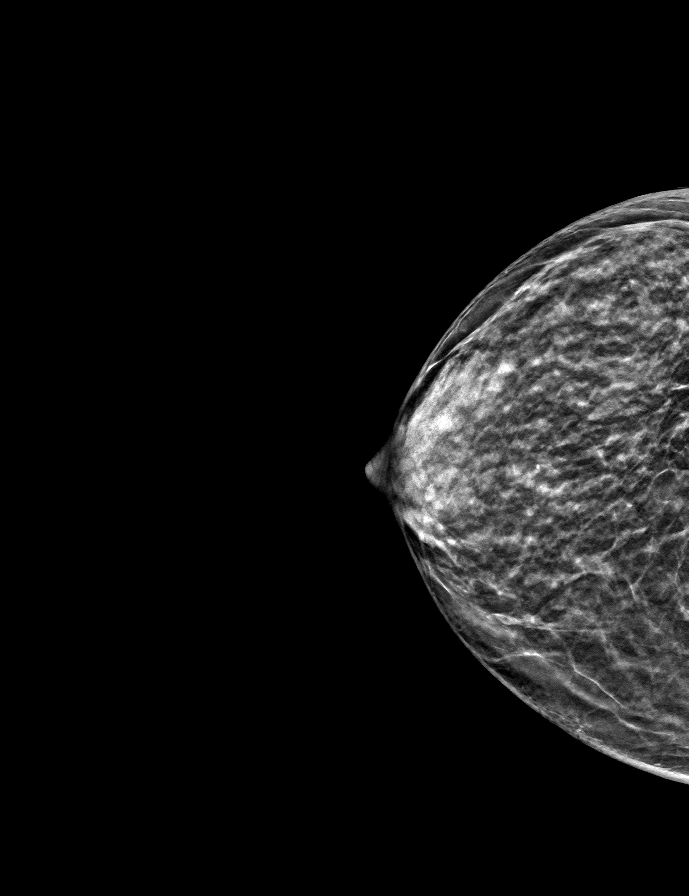
[frame 17/33]
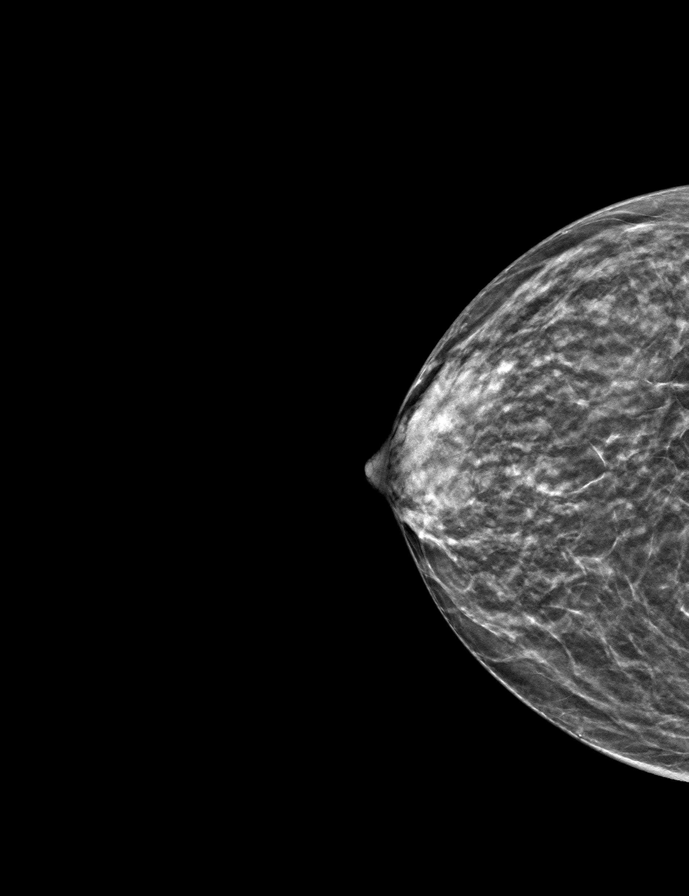

[R MLO tomo · tomo slice 17/33.0]
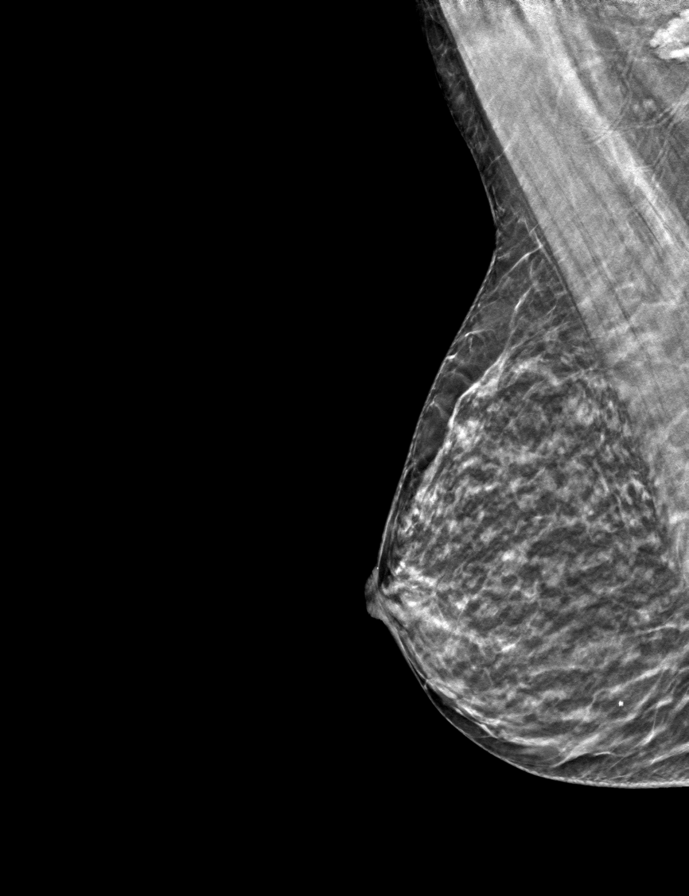

[L MLO tomo · tomo slice 17/32.0]
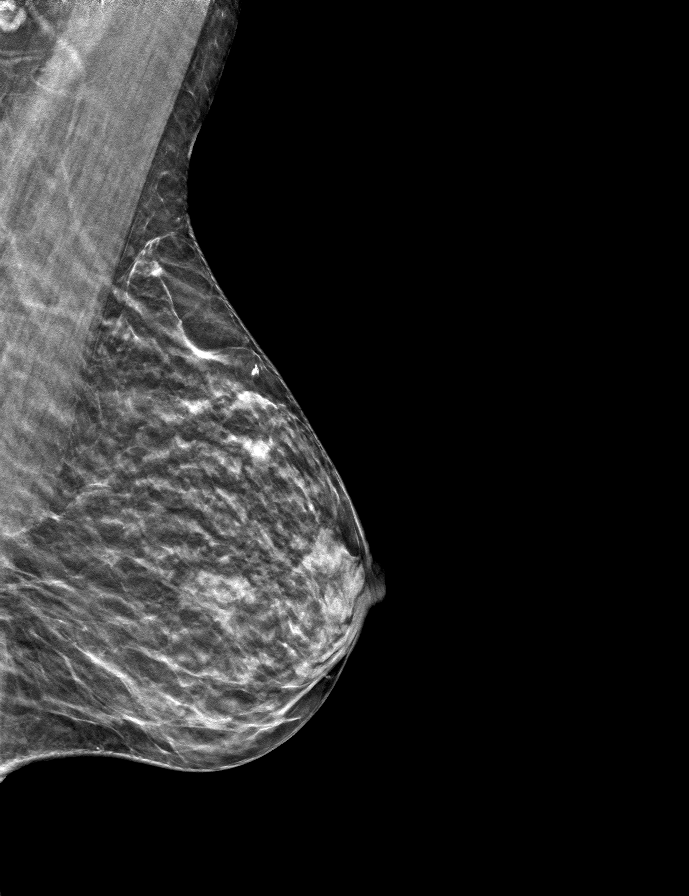

[L CC tomo · tomo slice 18/35.0]
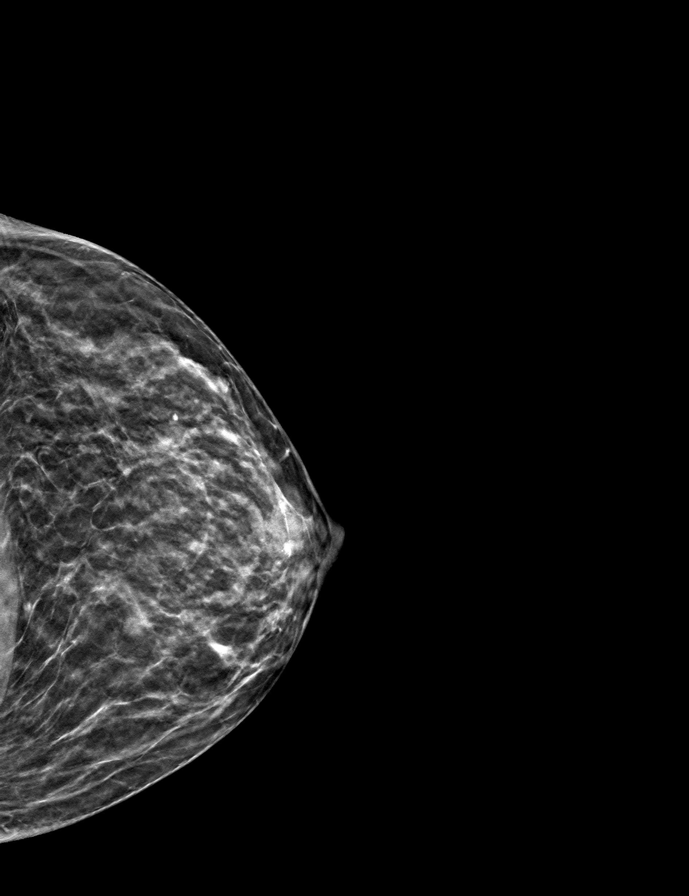

[9 of 24 positions shown; findings below may reference images not displayed]

ACR Breast Density Category c: The breast tissue is heterogeneously
dense, which may obscure small masses
FINDINGS: There are no findings suspicious for malignancy. Images were
processed with CAD.
IMPRESSION: No mammographic evidence of malignancy. A result letter of this
screening mammogram will be mailed directly to the patient.

RECOMMENDATION:
Screening mammogram in one year. (Code:EM-2-IHY)

BI-RADS CATEGORY  1: Negative.

## 2021-02-14 ENCOUNTER — Other Ambulatory Visit: Payer: Self-pay | Admitting: Internal Medicine

## 2021-02-14 DIAGNOSIS — Z1231 Encounter for screening mammogram for malignant neoplasm of breast: Secondary | ICD-10-CM

## 2021-02-19 ENCOUNTER — Other Ambulatory Visit: Payer: Self-pay

## 2021-02-19 ENCOUNTER — Ambulatory Visit
Admission: RE | Admit: 2021-02-19 | Discharge: 2021-02-19 | Disposition: A | Discharge status: Home / Self Care | Payer: Medicare Other | Source: Ambulatory Visit | Attending: Internal Medicine | Admitting: Internal Medicine

## 2021-02-19 DIAGNOSIS — Z1231 Encounter for screening mammogram for malignant neoplasm of breast: Secondary | ICD-10-CM | POA: Insufficient documentation

## 2021-03-08 ENCOUNTER — Ambulatory Visit: Payer: Medicare Other | Admitting: Dermatology

## 2021-03-08 ENCOUNTER — Other Ambulatory Visit: Payer: Self-pay

## 2021-03-08 DIAGNOSIS — D229 Melanocytic nevi, unspecified: Secondary | ICD-10-CM

## 2021-03-08 DIAGNOSIS — L578 Other skin changes due to chronic exposure to nonionizing radiation: Secondary | ICD-10-CM | POA: Diagnosis not present

## 2021-03-08 DIAGNOSIS — H61001 Unspecified perichondritis of right external ear: Secondary | ICD-10-CM

## 2021-03-08 DIAGNOSIS — Z1283 Encounter for screening for malignant neoplasm of skin: Secondary | ICD-10-CM

## 2021-03-08 DIAGNOSIS — D225 Melanocytic nevi of trunk: Secondary | ICD-10-CM

## 2021-03-08 DIAGNOSIS — D239 Other benign neoplasm of skin, unspecified: Secondary | ICD-10-CM

## 2021-03-08 DIAGNOSIS — L814 Other melanin hyperpigmentation: Secondary | ICD-10-CM

## 2021-03-08 DIAGNOSIS — L821 Other seborrheic keratosis: Secondary | ICD-10-CM

## 2021-03-08 DIAGNOSIS — L820 Inflamed seborrheic keratosis: Secondary | ICD-10-CM | POA: Diagnosis not present

## 2021-03-08 DIAGNOSIS — L853 Xerosis cutis: Secondary | ICD-10-CM

## 2021-03-08 DIAGNOSIS — D1800 Hemangioma unspecified site: Secondary | ICD-10-CM

## 2021-03-08 DIAGNOSIS — D492 Neoplasm of unspecified behavior of bone, soft tissue, and skin: Secondary | ICD-10-CM

## 2021-03-08 HISTORY — DX: Other benign neoplasm of skin, unspecified: D23.9

## 2021-03-08 MED ORDER — MOMETASONE FUROATE 0.1 % EX CREA
1.0000 | TOPICAL_CREAM | Freq: Every day | CUTANEOUS | 0 refills | Status: AC | PRN
Start: 2021-03-08 — End: ?

## 2021-03-08 NOTE — Patient Instructions (Addendum)

## 2021-03-08 NOTE — Progress Notes (Deleted)
New Patient Visit  Subjective  Sally Wilson is a 71 y.o. female who presents for the following: check spots (L preauricular x 1 yr, itches and scrapes off/R ear, 18yr, tender when lays on that side, peels off/Spot R flank, back, 2 yr, itchy) and Total body skin exam (No hx of skin ca). The patient presents for Total-Body Skin Exam (TBSE) for skin cancer screening and mole check.  The patient has spots, moles and lesions to be evaluated, some may be new or changing and the patient has concerns that these could be cancer.  The following portions of the chart were reviewed this encounter and updated as appropriate:   Tobacco   Allergies   Meds   Problems   Med Hx   Surg Hx   Fam Hx      Review of Systems:  No other skin or systemic complaints except as noted in HPI or Assessment and Plan.  Objective  Well appearing patient in no apparent distress; mood and affect are within normal limits.  A full examination was performed including scalp, head, eyes, ears, nose, lips, neck, chest, axillae, abdomen, back, buttocks, bilateral upper extremities, bilateral lower extremities, hands, feet, fingers, toes, fingernails, and toenails. All findings within normal limits unless otherwise noted below.  R mid helix Erythema and tenderness     R temple x 1, R mid back x 2, R flank x 2 (5) Stuck on waxy paps with erythema   L upper back paraspinla 2.5cm lat to spine 0.6cm dark brown macule         Assessment & Plan   Lentigines - Scattered tan macules - Due to sun exposure - Benign-appearing, observe - Recommend daily broad spectrum sunscreen SPF 30+ to sun-exposed areas, reapply every 2 hours as needed. - Call for any changes  Seborrheic Keratoses - Stuck-on, waxy, tan-brown papules and/or plaques  - Benign-appearing - Discussed benign etiology and prognosis. - Observe - Call for any changes  Melanocytic Nevi - Tan-brown and/or pink-flesh-colored symmetric macules and  papules - Benign appearing on exam today - Observation - Call clinic for new or changing moles - Recommend daily use of broad spectrum spf 30+ sunscreen to sun-exposed areas.   Hemangiomas - Red papules - Discussed benign nature - Observe - Call for any changes  Actinic Damage - Chronic condition, secondary to cumulative UV/sun exposure - diffuse scaly erythematous macules with underlying dyspigmentation - Recommend daily broad spectrum sunscreen SPF 30+ to sun-exposed areas, reapply every 2 hours as needed.  - Staying in the shade or wearing long sleeves, sun glasses (UVA+UVB protection) and wide brim hats (4-inch brim around the entire circumference of the hat) are also recommended for sun protection.  - Call for new or changing lesions.  Xerosis - diffuse xerotic patches - recommend gentle, hydrating skin care - gentle skin care handout given - Recommend Cerave cream Skin cancer screening performed today.  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  Trauma or pressure from sleeping on the ear or from cell phone use and sun damage may be exacerbating factors.  Treatment may include using a C-shaped airplane neck pillow for sleeping on the side of the head so no pressure is on the ear.  Other treatments include topical or intralesional  steroids; liquid nitrogen or laser destruction; shave removal or excision.  The condition can be difficult to treat and persist or recur despite treatment.  Related Medications mometasone (ELOCON) 0.1 % cream Apply 1 application topically daily as needed (Rash). Qd to aa right ear prn flares  Inflamed seborrheic keratosis (5) R temple x 1, R mid back x 2, R flank x 2  Destruction of lesion - R  temple x 1, R mid back x 2, R flank x 2 Complexity: simple   Destruction method: cryotherapy   Informed consent: discussed and consent obtained   Timeout:  patient name, date of birth, surgical site, and procedure verified Lesion destroyed using liquid nitrogen: Yes   Region frozen until ice ball extended beyond lesion: Yes   Outcome: patient tolerated procedure well with no complications   Post-procedure details: wound care instructions given    Neoplasm of skin L upper back paraspinla 2.5cm lat to spine  Epidermal / dermal shaving  Lesion diameter (cm):  0.6 Informed consent: discussed and consent obtained   Timeout: patient name, date of birth, surgical site, and procedure verified   Procedure prep:  Patient was prepped and draped in usual sterile fashion Prep type:  Isopropyl alcohol Anesthesia: the lesion was anesthetized in a standard fashion   Anesthetic:  1% lidocaine w/ epinephrine 1-100,000 buffered w/ 8.4% NaHCO3 Instrument used: flexible razor blade   Hemostasis achieved with: pressure, aluminum chloride and electrodesiccation   Outcome: patient tolerated procedure well   Post-procedure details: sterile dressing applied and wound care instructions given   Dressing type: bandage and bacitracin    Specimen 1 - Surgical pathology Differential Diagnosis: D48.5 Nevus vs Dysplastic nevus  Check Margins: yes 0.6cm dark brown macule  Return in about 1 year (around 03/08/2022).  I, Sally Wilson, RMA, am acting as scribe for Sally Ser, MD . Documentation: I have reviewed the above documentation for accuracy and completeness, and I agree with the above.  Sally Ser, MD

## 2021-03-13 ENCOUNTER — Telehealth: Payer: Self-pay

## 2021-03-13 ENCOUNTER — Encounter: Payer: Self-pay | Admitting: Dermatology

## 2021-03-13 NOTE — Progress Notes (Signed)
New Patient Visit  Subjective  Sally Wilson is a 71 y.o. female who presents for the following: check spots (L preauricular x 1 yr, itches and scrapes off/R ear, 36yr, tender when lays on that side, peels off/Spot R flank, back, 2 yr, itchy) and Total body skin exam (No hx of skin ca). The patient presents for Total-Body Skin Exam (TBSE) for skin cancer screening and mole check.  The patient has spots, moles and lesions to be evaluated, some may be new or changing and the patient has concerns that these could be cancer.   The following portions of the chart were reviewed this encounter and updated as appropriate:   Tobacco   Allergies   Meds   Problems   Med Hx   Surg Hx   Fam Hx      Review of Systems:  No other skin or systemic complaints except as noted in HPI or Assessment and Plan.  Objective  Well appearing patient in no apparent distress; mood and affect are within normal limits.  A full examination was performed including scalp, head, eyes, ears, nose, lips, neck, chest, axillae, abdomen, back, buttocks, bilateral upper extremities, bilateral lower extremities, hands, feet, fingers, toes, fingernails, and toenails. All findings within normal limits unless otherwise noted below.  R mid helix Erythema and tenderness     R temple x 1, R mid back x 2, R flank x 2 (5) Stuck on waxy paps with erythema   L upper back paraspinla 2.5cm lat to spine 0.6cm dark brown macule        Assessment & Plan   Lentigines - Scattered tan macules - Due to sun exposure - Benign-appearing, observe - Recommend daily broad spectrum sunscreen SPF 30+ to sun-exposed areas, reapply every 2 hours as needed. - Call for any changes  Seborrheic Keratoses - Stuck-on, waxy, tan-brown papules and/or plaques  - Benign-appearing - Discussed benign etiology and prognosis. - Observe - Call for any changes  Melanocytic Nevi - Tan-brown and/or pink-flesh-colored symmetric macules and  papules - Benign appearing on exam today - Observation - Call clinic for new or changing moles - Recommend daily use of broad spectrum spf 30+ sunscreen to sun-exposed areas.   Hemangiomas - Red papules - Discussed benign nature - Observe - Call for any changes  Actinic Damage - Chronic condition, secondary to cumulative UV/sun exposure - diffuse scaly erythematous macules with underlying dyspigmentation - Recommend daily broad spectrum sunscreen SPF 30+ to sun-exposed areas, reapply every 2 hours as needed.  - Staying in the shade or wearing long sleeves, sun glasses (UVA+UVB protection) and wide brim hats (4-inch brim around the entire circumference of the hat) are also recommended for sun protection.  - Call for new or changing lesions.  Skin cancer screening performed today.  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  Trauma or pressure from sleeping on the ear or from cell phone use and sun damage may be exacerbating factors.  Treatment may include using a C-shaped airplane neck pillow for sleeping on the side of the head so no pressure is on the ear.  Other treatments include topical or intralesional steroids; liquid nitrogen or laser destruction; shave removal or excision.  The condition can be difficult to treat and persist or recur despite treatment.   Start Mometasone cr qhs aa prn flares 15g, 0rf  Related Medications mometasone (ELOCON) 0.1 % cream Apply 1 application topically daily as needed (Rash). Qd to aa right ear prn flares  Inflamed seborrheic keratosis (5) R temple x 1, R mid back x 2, R flank x 2  Destruction of lesion - R temple x 1, R mid  back x 2, R flank x 2 Complexity: simple   Destruction method: cryotherapy   Informed consent: discussed and consent obtained   Timeout:  patient name, date of birth, surgical site, and procedure verified Lesion destroyed using liquid nitrogen: Yes   Region frozen until ice ball extended beyond lesion: Yes   Outcome: patient tolerated procedure well with no complications   Post-procedure details: wound care instructions given    Neoplasm of skin L upper back paraspinla 2.5cm lat to spine  Epidermal / dermal shaving  Lesion diameter (cm):  0.6 Informed consent: discussed and consent obtained   Timeout: patient name, date of birth, surgical site, and procedure verified   Procedure prep:  Patient was prepped and draped in usual sterile fashion Prep type:  Isopropyl alcohol Anesthesia: the lesion was anesthetized in a standard fashion   Anesthetic:  1% lidocaine w/ epinephrine 1-100,000 buffered w/ 8.4% NaHCO3 Instrument used: flexible razor blade   Hemostasis achieved with: pressure, aluminum chloride and electrodesiccation   Outcome: patient tolerated procedure well   Post-procedure details: sterile dressing applied and wound care instructions given   Dressing type: bandage and bacitracin    Specimen 1 - Surgical pathology Differential Diagnosis: D48.5 Nevus vs Dysplastic nevus  Check Margins: yes 0.6cm dark brown macule  Skin cancer screening   Return in about 1 year (around 03/08/2022).  I, [name], RMA, am acting as scribe for Sarina Ser, MD . Documentation: I have reviewed the above documentation for accuracy and completeness, and I agree with the above.  Sarina Ser, MD

## 2021-03-13 NOTE — Telephone Encounter (Signed)
Discussed biopsy results with pt, return for surgery removal

## 2021-03-13 NOTE — Telephone Encounter (Signed)
-----   Message from Ralene Bathe, MD sent at 03/12/2021  5:32 PM EST ----- Diagnosis Skin , left upper back paraspinal 2.5cm lat to spine DYSPLASTIC COMPOUND NEVUS WITH SEVERE ATYPIA, LATERAL MARGIN INVOLVED, SEE DESCRIPTION  Severe dysplastic Schedule surgery

## 2021-04-17 ENCOUNTER — Ambulatory Visit: Payer: Medicare Other | Admitting: Dermatology

## 2021-04-17 ENCOUNTER — Other Ambulatory Visit: Payer: Self-pay

## 2021-04-17 ENCOUNTER — Encounter: Payer: Self-pay | Admitting: Dermatology

## 2021-04-17 DIAGNOSIS — D239 Other benign neoplasm of skin, unspecified: Secondary | ICD-10-CM

## 2021-04-17 DIAGNOSIS — D235 Other benign neoplasm of skin of trunk: Secondary | ICD-10-CM

## 2021-04-17 MED ORDER — MUPIROCIN 2 % EX OINT: 1.0000 "application " | TOPICAL_OINTMENT | Freq: Every day | CUTANEOUS | 1 refills | Status: AC

## 2021-04-17 NOTE — Progress Notes (Signed)
? ?  Follow-Up Visit ?  ?Subjective  ?Sally Wilson is a 71 y.o. female who presents for the following: Severe dysplastic nevus, bx proven (L upper back paraspinal, 2.5cm lat to spine, pt presents for excision today). ? ?The following portions of the chart were reviewed this encounter and updated as appropriate:  ? Tobacco  Allergies  Meds  Problems  Med Hx  Surg Hx  Fam Hx   ?  ?Review of Systems:  No other skin or systemic complaints except as noted in HPI or Assessment and Plan. ? ?Objective  ?Well appearing patient in no apparent distress; mood and affect are within normal limits. ? ?A focused examination was performed including back. Relevant physical exam findings are noted in the Assessment and Plan. ? ?Left Upper Back paraspinal, 2.5cm lat to spine ?Pink bx site ? ? ?Assessment & Plan  ?Dysplastic nevus ?Left Upper Back paraspinal, 2.5cm lat to spine ? ?Severe, bx proven, excise today ? ?Start Mupirocin oint qd to excision site ? ?mupirocin ointment (BACTROBAN) 2 % - Left Upper Back paraspinal, 2.5cm lat to spine ?Apply 1 application. topically daily. Qd to excision site ? ?Skin excision - Left Upper Back paraspinal, 2.5cm lat to spine ? ?Lesion length (cm):  0.7 ?Lesion width (cm):  0.7 ?Margin per side (cm):  0.2 ?Total excision diameter (cm):  1.1 ?Informed consent: discussed and consent obtained   ?Timeout: patient name, date of birth, surgical site, and procedure verified   ?Procedure prep:  Patient was prepped and draped in usual sterile fashion ?Prep type:  Isopropyl alcohol and povidone-iodine ?Anesthesia: the lesion was anesthetized in a standard fashion   ?Anesthetic:  1% lidocaine w/ epinephrine 1-100,000 buffered w/ 8.4% NaHCO3 ?Instrument used: #15 blade   ?Hemostasis achieved with: pressure   ?Hemostasis achieved with comment:  Electrocautery ?Outcome: patient tolerated procedure well with no complications   ?Post-procedure details: sterile dressing applied and wound care instructions  given   ?Dressing type: bandage and pressure dressing (Mupirocin)   ? ?Skin repair - Left Upper Back paraspinal, 2.5cm lat to spine ?Complexity:  Complex 4 cm ?Reason for type of repair: reduce tension to allow closure, reduce the risk of dehiscence, infection, and necrosis, reduce subcutaneous dead space and avoid a hematoma, allow closure of the large defect, preserve normal anatomy, preserve normal anatomical and functional relationships and enhance both functionality and cosmetic results   ?Undermining: area extensively undermined   ?Undermining comment:  Undermining Defect 1.1 cm ?Subcutaneous layers (deep stitches):  ?Suture size:  2-0 ?Suture type: Vicryl (polyglactin 910)   ?Subcutaneous suture technique: Inverted Dermal. ?Fine/surface layer approximation (top stitches):  ?Suture size:  3-0 ?Suture type: nylon   ?Stitches: simple running   ?Suture removal (days):  7 ?Hemostasis achieved with: pressure ?Outcome: patient tolerated procedure well with no complications   ?Post-procedure details: sterile dressing applied and wound care instructions given   ?Dressing type: bandage, pressure dressing and bacitracin (Mupirocin)   ? ?Specimen 1 - Surgical pathology ?Differential Diagnosis: D48.5 Bx proven Severe dysplastic nevus ? ?Check Margins: yes ?Pink bx site ?[id number] ? ?Return in about 1 week (around 04/24/2021) for suture removal. ? ?Documentation: I have reviewed the above documentation for accuracy and completeness, and I agree with the above. ? ?Sarina Ser, MD ? ?

## 2021-04-17 NOTE — Patient Instructions (Signed)
Wound Care Instructions  On the day following your surgery, you should begin doing daily dressing changes: Remove the old dressing and discard it. Cleanse the wound gently with tap water. This may be done in the shower or by placing a wet gauze pad directly on the wound and letting it soak for several minutes. It is important to gently remove any dried blood from the wound in order to encourage healing. This may be done by gently rolling a moistened Q-tip on the dried blood. Do not pick at the wound. If the wound should start to bleed, continue cleaning the wound, then place a moist gauze pad on the wound and hold pressure for a few minutes.  Make sure you then dry the skin surrounding the wound completely or the tape will not stick to the skin. Do not use cotton balls on the wound. After the wound is clean and dry, apply the ointment gently with a Q-tip. Cut a non-stick pad to fit the size of the wound. Lay the pad flush to the wound. If the wound is draining, you may want to reinforce it with a small amount of gauze on top of the non-stick pad for a little added compression to the area. Use the tape to seal the area completely. Select from the following with respect to your individual situation: If your wound has been stitched closed: continue the above steps 1-8 at least daily until your sutures are removed. If your wound has been left open to heal: continue steps 1-8 at least daily for the first 3-4 weeks. We would like for you to take a few extra precautions for at least the next week. Sleep with your head elevated on pillows if our wound is on your head. Do not bend over or lift heavy items to reduce the chance of elevated blood pressure to the wound Do not participate in particularly strenuous activities.   Below is a list of dressing supplies you might need.  Cotton-tipped applicators - Q-tips Gauze pads (2x2 and/or 4x4) - All-Purpose Sponges Non-stick dressing material - Telfa Tape -  Paper or Hypafix New and clean tube of petroleum jelly - Vaseline    Comments on Post-Operative Period Slight swelling and redness often appear around the wound. This is normal and will disappear within several days following the surgery. The healing wound will drain a brownish-red-yellow discharge during healing. This is a normal phase of wound healing. As the wound begins to heal, the drainage may increase in amount. Again, this drainage is normal. Notify us if the drainage becomes persistently bloody, excessively swollen, or intensely painful or develops a foul odor or red streaks.  If you should experience mild discomfort during the healing phase, you may take an aspirin-free medication such as Tylenol (acetaminophen). Notify us if the discomfort is severe or persistent. Avoid alcoholic beverages when taking pain medicine.  In Case of Wound Hemorrhage A wound hemorrhage is when the bandage suddenly becomes soaked with bright red blood and flows profusely. If this happens, sit down or lie down with your head elevated. If the wound has a dressing on it, do not remove the dressing. Apply pressure to the existing gauze. If the wound is not covered, use a gauze pad to apply pressure and continue applying the pressure for 20 minutes without peeking. DO NOT COVER THE WOUND WITH A LARGE TOWEL OR [place] CLOTH. Release your hand from the wound site but do not remove the dressing. If the bleeding has stopped,  gently clean around the wound. Leave the dressing in place for 24 hours if possible. This wait time allows the blood vessels to close off so that you do not spark a new round of bleeding by disrupting the newly clotted blood vessels with an immediate dressing change. If the bleeding does not subside, continue to hold pressure. If matters are out of your control, contact an After Hours clinic or go to the Emergency Room.   If You Need Anything After Your Visit  If you have any questions or concerns for  your doctor, please call our main line at [phone] and press option 4 to reach your doctor's medical assistant. If no one answers, please leave a voicemail as directed and we will return your call as soon as possible. Messages left after 4 pm will be answered the following business day.   You may also send [place] a message via [place]. We typically respond to MyChart messages within 1-2 business days.  For prescription refills, please ask your pharmacy to contact our office. Our fax number is [phone].  If you have an urgent issue when the clinic is closed that cannot wait until the next business day, you can page your doctor at the number below.    Please note that while we do our best to be available for urgent issues outside of office hours, we are not available 24/7.   If you have an urgent issue and are unable to reach [place], you may choose to seek medical care at your doctor's office, retail clinic, urgent care center, or emergency room.  If you have a medical emergency, please immediately call 911 or go to the emergency department.  Pager Numbers  - Dr. Nehemiah Massed: [phone]  - Dr. Laurence Ferrari: [phone]  - Dr. Nicole Kindred: [phone]  In the event of inclement weather, please call our main line at [phone] for an update on the status of any delays or closures.  Dermatology Medication Tips: Please keep the boxes that topical medications come in in order to help keep track of the instructions about where and how to use these. Pharmacies typically print the medication instructions only on the boxes and not directly on the medication tubes.   If your medication is too expensive, please contact our office at [phone] option 4 or send [place] a message through [place].   We are unable to tell what your co-pay for medications will be in advance as this is different depending on your insurance coverage. However, we may be able to find a substitute medication at lower cost or fill out  paperwork to get insurance to cover a needed medication.   If a prior authorization is required to get your medication covered by your insurance company, please allow [place] 1-2 business days to complete this process.  Drug prices often vary depending on where the prescription is filled and some pharmacies may offer cheaper prices.  The website www.goodrx.com contains coupons for medications through different pharmacies. The prices here do not account for what the cost may be with help from insurance (it may be cheaper with your insurance), but the website can give you the price if you did not use any insurance.  - You can print the associated coupon and take it with your prescription to the pharmacy.  - You may also stop by our office during regular business hours and pick up a GoodRx coupon card.  - If you need your prescription sent electronically to a different pharmacy, notify our office  through [facility] or by phone at [phone] option 4.     Si Usted Necesita Algo Despus de Su Visita  Tambin puede enviarnos un mensaje a travs de Pharmacist, community. Por lo general respondemos a los mensajes de MyChart en el transcurso de 1 a 2 das hbiles.  Para renovar recetas, por favor pida a su farmacia que se ponga en contacto con nuestra oficina. Harland Dingwall de fax es [place] [phone].  Si tiene un asunto urgente cuando la clnica est cerrada y que no puede esperar hasta el siguiente da hbil, puede llamar/localizar a su doctor(a) al nmero que aparece a continuacin.   Por favor, tenga en cuenta que aunque hacemos todo lo posible para estar disponibles para asuntos urgentes fuera del horario de [place], no estamos disponibles las 24 horas del da, los 7 das de la [place].   Si tiene un problema urgente y no puede comunicarse con nosotros, puede optar por buscar atencin mdica  en el consultorio de su doctor(a), en una clnica privada, en un centro de atencin urgente o en una sala de  emergencias.  Si tiene Engineering geologist, por favor llame inmediatamente al 911 o vaya a la sala de emergencias.  Nmeros de bper  - Dr. Nehemiah Massed: [phone]  - Dra. Moye: [phone]  - Dra. Nicole Kindred: [phone]  En caso de inclemencias del [place], por favor llame a Johnsie Kindred principal al [phone] para una actualizacin sobre el [place] de cualquier retraso o cierre.  Consejos para la medicacin en dermatologa: Por favor, guarde las cajas en las que vienen los medicamentos de uso tpico para ayudarle a seguir las instrucciones sobre dnde y cmo usarlos. Las farmacias generalmente imprimen las instrucciones del medicamento slo en las cajas y no directamente en los tubos del [place].   Si su medicamento es muy caro, por favor, pngase en contacto con Zigmund Daniel llamando al [phone] y presione la opcin 4 o envenos un mensaje a travs de Pharmacist, community.   No podemos decirle cul ser su copago por los medicamentos por adelantado ya que esto es diferente dependiendo de la cobertura de su seguro. Sin embargo, es posible que podamos encontrar un medicamento sustituto a Electrical engineer un formulario para que el seguro cubra el medicamento que se considera necesario.   Si se requiere una autorizacin previa para que su compaa de seguros [place] su medicamento, por favor permtanos de 1 a 2 das hbiles para completar este proceso.  Los precios de los medicamentos varan con frecuencia dependiendo del Environmental consultant de dnde se surte la receta y alguna farmacias pueden ofrecer precios ms baratos.  El sitio web www.goodrx.com tiene cupones para medicamentos de Airline pilot. Los precios aqu no tienen en cuenta lo que podra costar con la ayuda del seguro (puede ser ms barato con su seguro), pero el sitio web puede darle el precio si no utiliz Research scientist (physical sciences).  - Puede imprimir el cupn correspondiente y llevarlo con su receta a la farmacia.  - Tambin puede pasar por  nuestra oficina durante el horario de atencin regular y Charity fundraiser una tarjeta de cupones de GoodRx.  - Si necesita que su receta se enve electrnicamente a una farmacia diferente, informe a nuestra oficina a travs de MyChart de [place] o por telfono llamando al [phone] y presione la opcin 4.

## 2021-04-18 ENCOUNTER — Encounter: Payer: Self-pay | Admitting: Dermatology

## 2021-04-18 ENCOUNTER — Telehealth: Payer: Self-pay

## 2021-04-18 NOTE — Telephone Encounter (Signed)
Spoke with patient regarding surgery. She is doing fine/hd 

## 2021-04-24 ENCOUNTER — Other Ambulatory Visit: Payer: Self-pay

## 2021-04-24 ENCOUNTER — Ambulatory Visit (INDEPENDENT_AMBULATORY_CARE_PROVIDER_SITE_OTHER): Payer: Medicare Other | Admitting: Dermatology

## 2021-04-24 DIAGNOSIS — Z4802 Encounter for removal of sutures: Secondary | ICD-10-CM

## 2021-04-24 DIAGNOSIS — D229 Melanocytic nevi, unspecified: Secondary | ICD-10-CM

## 2021-04-24 DIAGNOSIS — Z86018 Personal history of other benign neoplasm: Secondary | ICD-10-CM

## 2021-04-24 NOTE — Progress Notes (Signed)
? ?  Follow-Up Visit ?  ?Subjective  ?Sally Wilson is a 71 y.o. female who presents for the following: Suture / Staple Removal (Left upper back. Excision 04/17/2021). ? ?The following portions of the chart were reviewed this encounter and updated as appropriate:  Tobacco  Allergies  Meds  Problems  Med Hx  Surg Hx  Fam Hx   ?  ?Review of Systems: No other skin or systemic complaints except as noted in HPI or Assessment and Plan. ? ?Objective  ?Well appearing patient in no apparent distress; mood and affect are within normal limits. ? ?A focused examination was performed including back. Relevant physical exam findings are noted in the Assessment and Plan. ? ? ?Assessment & Plan  ?History of severe dysplastic nevus of the left upper back paraspinal, 2.5 cm lateral to spine --status post excision.  Pathology shows margins free ?Encounter for Removal of Sutures ?- Incision site at the left upper back is clean, dry and intact ?- Wound cleansed, sutures removed, wound cleansed and steri strips applied.  ?- Discussed pathology results showing residual atypical melanocytes, margins free  ?- Patient advised to keep steri-strips dry until they fall off. ?- Scars remodel for a full year. ?- Once steri-strips fall off, patient can apply over-the-counter silicone scar cream each night to help with scar remodeling if desired. ?- Patient advised to call with any concerns or if they notice any new or changing lesions. ? ? ?Return for TBSE As Scheduled. ? ?I, Emelia Salisbury, CMA, am acting as scribe for Sarina Ser, MD. ?Documentation: I have reviewed the above documentation for accuracy and completeness, and I agree with the above. ? ?Sarina Ser, MD ? ? ?

## 2021-04-24 NOTE — Patient Instructions (Signed)

## 2021-05-04 ENCOUNTER — Encounter: Payer: Self-pay | Admitting: Dermatology

## 2021-07-14 ENCOUNTER — Emergency Department
Admission: EM | Admit: 2021-07-14 | Discharge: 2021-07-14 | Disposition: A | Discharge status: Home / Self Care | Payer: Medicare Other | Attending: Student in an Organized Health Care Education/Training Program | Admitting: Student in an Organized Health Care Education/Training Program

## 2021-07-14 ENCOUNTER — Encounter: Payer: Self-pay | Admitting: Medical Oncology

## 2021-07-14 DIAGNOSIS — M545 Low back pain, unspecified: Secondary | ICD-10-CM | POA: Insufficient documentation

## 2021-07-14 LAB — URINALYSIS, ROUTINE W REFLEX MICROSCOPIC
Bacteria, UA: NONE SEEN
Bilirubin Urine: NEGATIVE
Glucose, UA: NEGATIVE mg/dL
Ketones, ur: NEGATIVE mg/dL
Leukocytes,Ua: NEGATIVE
Nitrite: NEGATIVE
Protein, ur: NEGATIVE mg/dL
Specific Gravity, Urine: 1.012 (ref 1.005–1.030)
pH: 5 (ref 5.0–8.0)

## 2021-07-14 LAB — BASIC METABOLIC PANEL
Anion gap: 4 — ABNORMAL LOW (ref 5–15)
BUN: 21 mg/dL (ref 8–23)
CO2: 29 mmol/L (ref 22–32)
Calcium: 9.1 mg/dL (ref 8.9–10.3)
Chloride: 110 mmol/L (ref 98–111)
Creatinine, Ser: 0.82 mg/dL (ref 0.44–1.00)
GFR, Estimated: >60 mL/min (ref >60)
Glucose, Bld: 120 mg/dL — ABNORMAL HIGH (ref 70–99)
Potassium: 3.8 mmol/L (ref 3.5–5.1)
Sodium: 143 mmol/L (ref 135–145)

## 2021-07-14 LAB — CBC WITH DIFFERENTIAL/PLATELET
Abs Immature Granulocytes: 0.04 10*3/uL (ref 0.00–0.07)
Basophils Absolute: 0 10*3/uL (ref 0.0–0.1)
Basophils Relative: 0 %
Eosinophils Absolute: 0.2 10*3/uL (ref 0.0–0.5)
Eosinophils Relative: 3 %
HCT: 40.9 % (ref 36.0–46.0)
Hemoglobin: 13.3 g/dL (ref 12.0–15.0)
Immature Granulocytes: 1 %
Lymphocytes Relative: 17 %
Lymphs Abs: 1.2 10*3/uL (ref 0.7–4.0)
MCH: 30.2 pg (ref 26.0–34.0)
MCHC: 32.5 g/dL (ref 30.0–36.0)
MCV: 93 fL (ref 80.0–100.0)
Monocytes Absolute: 0.5 10*3/uL (ref 0.1–1.0)
Monocytes Relative: 6 %
Neutro Abs: 5.4 10*3/uL (ref 1.7–7.7)
Neutrophils Relative %: 73 %
Platelets: 151 10*3/uL (ref 150–400)
RBC: 4.4 MIL/uL (ref 3.87–5.11)
RDW: 12.8 % (ref 11.5–15.5)
WBC: 7.4 10*3/uL (ref 4.0–10.5)
nRBC: 0 % (ref 0.0–0.2)

## 2021-07-14 MED ORDER — ONDANSETRON HCL 4 MG/2ML IJ SOLN
4.0000 mg | Freq: Once | INTRAMUSCULAR | Status: AC
  Administered 2021-07-14: 4 mg via INTRAVENOUS
  Filled 2021-07-14: qty 2

## 2021-07-14 MED ORDER — MORPHINE SULFATE (PF) 4 MG/ML IV SOLN
4.0000 mg | Freq: Once | INTRAVENOUS | Status: AC
  Administered 2021-07-14: 4 mg via INTRAVENOUS
  Filled 2021-07-14: qty 1

## 2021-07-14 MED ORDER — FENTANYL CITRATE PF 50 MCG/ML IJ SOSY
50.0000 ug | PREFILLED_SYRINGE | Freq: Once | INTRAMUSCULAR | Status: AC
  Administered 2021-07-14: 50 ug via INTRAVENOUS
  Filled 2021-07-14: qty 1

## 2021-07-14 MED ORDER — OXYCODONE-ACETAMINOPHEN 5-325 MG PO TABS: 1.0000 | ORAL_TABLET | ORAL | 0 refills | Status: DC | PRN

## 2021-07-14 MED ORDER — BACLOFEN 10 MG PO TABS: 10.0000 mg | ORAL_TABLET | Freq: Three times a day (TID) | ORAL | 0 refills | Status: DC

## 2021-07-14 MED ORDER — SODIUM CHLORIDE 0.9 % IV BOLUS
1000.0000 mL | Freq: Once | INTRAVENOUS | Status: AC
  Administered 2021-07-14: 1000 mL via INTRAVENOUS

## 2021-07-14 MED ORDER — METOCLOPRAMIDE HCL 5 MG/ML IJ SOLN
10.0000 mg | Freq: Once | INTRAMUSCULAR | Status: AC
  Administered 2021-07-14: 10 mg via INTRAVENOUS
  Filled 2021-07-14: qty 2

## 2021-07-14 MED ORDER — PREDNISONE 10 MG (21) PO TBPK
ORAL_TABLET | ORAL | 0 refills | Status: DC
Start: 2021-07-14 — End: 2021-07-24

## 2021-07-14 NOTE — ED Notes (Signed)
See triage note. Provider at bedside.

## 2021-07-14 NOTE — Discharge Instructions (Addendum)
Follow-up Dr. Doy Hutching not improving in 3 days.  Return emergency department worsening.  Take medication as prescribed.

## 2021-07-14 NOTE — ED Triage Notes (Signed)
Pt reports that she has been having rt sided lower back pain x 1 week. Pt reports baclofen is what she usually takes.

## 2021-07-14 NOTE — ED Notes (Signed)
Provider at bedside again.

## 2021-07-14 NOTE — ED Provider Notes (Signed)
[facility] Provider Note    Event Date/Time   First MD Initiated Contact with Patient 07/14/21 1010     (approximate)   History   Back Pain   HPI  Sally Wilson is a 71 y.o. female with history of chronic back pain presents emergency department with worsening back pain over the last few days.  Patient states she also had a kidney infection at 1 time which was similar to her pain now.  She denies fever or chills.  Has a lot of nausea.  Started with nausea yesterday.  Unsure if it is from the pain or if it is a kidney infection.  No diarrhea.  No known injury.  Patient usually takes baclofen and that has not helped.  Also tried to take 3 Aleve but threw it up.      Physical Exam   Triage Vital Signs: ED Triage Vitals  Enc Vitals Group     BP 07/14/21 0950 (!) 152/108     Pulse Rate 07/14/21 0950 99     Resp 07/14/21 0950 18     Temp 07/14/21 0950 98.1 F (36.7 C)     Temp Source 07/14/21 0950 Oral     SpO2 07/14/21 0950 100 %     Weight 07/14/21 0951 134 lb (60.8 kg)     Height 07/14/21 0951 '5\' 6"'$  (1.676 m)     Head Circumference --      Peak Flow --      Pain Score 07/14/21 0951 10     Pain Loc --      Pain Edu? --      Excl. in [place]? --     Most recent vital signs: Vitals:   07/14/21 0950  BP: (!) 152/108  Pulse: 99  Resp: 18  Temp: 98.1 F (36.7 C)  SpO2: 100%     General: Awake, no distress.   CV:  Good peripheral perfusion. regular rate and  rhythm Resp:  Normal effort.  Abd:  No distention.  Nontender Other:  SI joint tender on the right side, patient walks very slowly is having difficulty moving, neurovascular is intact   ED Results / Procedures / Treatments   Labs (all labs ordered are listed, but only abnormal results are displayed) Labs Reviewed  BASIC METABOLIC PANEL - Abnormal; Notable for the following components:      Result Value   Glucose, Bld 120 (*)    Anion gap 4 (*)    All other components within  normal limits  URINALYSIS, ROUTINE W REFLEX MICROSCOPIC - Abnormal; Notable for the following components:   Color, Urine YELLOW (*)    APPearance CLEAR (*)    Hgb urine dipstick SMALL (*)    All other components within normal limits  CBC WITH DIFFERENTIAL/PLATELET     EKG     RADIOLOGY     PROCEDURES:   Procedures   MEDICATIONS ORDERED IN ED: Medications  sodium chloride 0.9 % bolus 1,000 mL (0 mLs Intravenous Stopped 07/14/21 1129)  ondansetron (ZOFRAN) injection 4 mg (4 mg Intravenous Given 07/14/21 1035)  morphine (PF) 4 MG/ML injection 4 mg (4 mg Intravenous Given 07/14/21 1036)  fentaNYL (SUBLIMAZE) injection 50 mcg (50 mcg Intravenous Given 07/14/21 1129)  metoCLOPramide (REGLAN) injection 10 mg (10 mg Intravenous Given 07/14/21 1129)     IMPRESSION / MDM / ASSESSMENT AND PLAN / ED COURSE  I reviewed the triage vital signs and the nursing notes.  Differential diagnosis includes, but is not limited to, chronic back pain acute exasperation, muscle strain, kidney stone, pyelonephritis, UTI  Patient's presentation is most consistent with acute complicated illness / injury requiring diagnostic workup.   Patient's labs are reassuring, CBC metabolic panel and urinalysis are normal.  Do not feel that patient has a kidney stone as it is not colicky type pain along with a normal urinalysis.  Not pyelonephritis as her white count is not elevated and her urinalysis is normal.  Do feel this may be an acute exasperation of her low back pain.  She had relief after the second dose of medication.  First dose was Zofran and morphine.  Second dose was fentanyl and Reglan she had continued nausea.  She is feeling better with this medication.  Feel that she is stable to be discharged and will not need admission for pain control.  She is in agreement treatment plan.  She is to follow-up with Dr. Doy Hutching if not improving in 3 days.  Return if worsening.  She was  discharged stable condition with a prescription for Sterapred, baclofen, Percocet.       FINAL CLINICAL IMPRESSION(S) / ED DIAGNOSES   Final diagnoses:  Acute right-sided low back pain without sciatica     Rx / DC Orders   ED Discharge Orders          Ordered    predniSONE (STERAPRED UNI-PAK 21 TAB) 10 MG (21) TBPK tablet        07/14/21 1204    baclofen (LIORESAL) 10 MG tablet  3 times daily        07/14/21 1204    oxyCODONE-acetaminophen (PERCOCET) 5-325 MG tablet  Every 4 hours PRN        07/14/21 1204             Note:  This document was prepared using Dragon voice recognition software and may include unintentional dictation errors.    Versie Starks, PA-C 07/14/21 1206    Merlyn Lot, MD 07/14/21 1447

## 2021-07-18 ENCOUNTER — Emergency Department
Admission: EM | Admit: 2021-07-18 | Discharge: 2021-07-18 | Disposition: A | Discharge status: Home / Self Care | Payer: Medicare Other | Source: Home / Self Care | Attending: Emergency Medicine | Admitting: Emergency Medicine

## 2021-07-18 ENCOUNTER — Encounter: Payer: Self-pay | Admitting: Medical Oncology

## 2021-07-18 ENCOUNTER — Emergency Department: Payer: Medicare Other

## 2021-07-18 DIAGNOSIS — N200 Calculus of kidney: Secondary | ICD-10-CM | POA: Insufficient documentation

## 2021-07-18 DIAGNOSIS — E876 Hypokalemia: Secondary | ICD-10-CM | POA: Insufficient documentation

## 2021-07-18 DIAGNOSIS — M549 Dorsalgia, unspecified: Secondary | ICD-10-CM | POA: Diagnosis not present

## 2021-07-18 DIAGNOSIS — S76011A Strain of muscle, fascia and tendon of right hip, initial encounter: Secondary | ICD-10-CM | POA: Diagnosis not present

## 2021-07-18 LAB — CBC WITH DIFFERENTIAL/PLATELET
Abs Immature Granulocytes: 0.04 10*3/uL (ref 0.00–0.07)
Basophils Absolute: 0 10*3/uL (ref 0.0–0.1)
Basophils Relative: 0 %
Eosinophils Absolute: 0 10*3/uL (ref 0.0–0.5)
Eosinophils Relative: 0 %
HCT: 44.8 % (ref 36.0–46.0)
Hemoglobin: 15.5 g/dL — ABNORMAL HIGH (ref 12.0–15.0)
Immature Granulocytes: 1 %
Lymphocytes Relative: 13 %
Lymphs Abs: 1.1 10*3/uL (ref 0.7–4.0)
MCH: 30.9 pg (ref 26.0–34.0)
MCHC: 34.6 g/dL (ref 30.0–36.0)
MCV: 89.4 fL (ref 80.0–100.0)
Monocytes Absolute: 0.3 10*3/uL (ref 0.1–1.0)
Monocytes Relative: 4 %
Neutro Abs: 6.9 10*3/uL (ref 1.7–7.7)
Neutrophils Relative %: 82 %
Platelets: 159 10*3/uL (ref 150–400)
RBC: 5.01 MIL/uL (ref 3.87–5.11)
RDW: 12.4 % (ref 11.5–15.5)
WBC: 8.4 10*3/uL (ref 4.0–10.5)
nRBC: 0 % (ref 0.0–0.2)

## 2021-07-18 LAB — COMPREHENSIVE METABOLIC PANEL
ALT: 19 U/L (ref 0–44)
AST: 17 U/L (ref 15–41)
Albumin: 3.7 g/dL (ref 3.5–5.0)
Alkaline Phosphatase: 59 U/L (ref 38–126)
Anion gap: 7 (ref 5–15)
BUN: 18 mg/dL (ref 8–23)
CO2: 28 mmol/L (ref 22–32)
Calcium: 8.9 mg/dL (ref 8.9–10.3)
Chloride: 104 mmol/L (ref 98–111)
Creatinine, Ser: 0.9 mg/dL (ref 0.44–1.00)
GFR, Estimated: >60 mL/min (ref >60)
Glucose, Bld: 126 mg/dL — ABNORMAL HIGH (ref 70–99)
Potassium: 3.1 mmol/L — ABNORMAL LOW (ref 3.5–5.1)
Sodium: 139 mmol/L (ref 135–145)
Total Bilirubin: 0.9 mg/dL (ref 0.3–1.2)
Total Protein: 6.5 g/dL (ref 6.5–8.1)

## 2021-07-18 LAB — URINALYSIS, ROUTINE W REFLEX MICROSCOPIC
Bilirubin Urine: NEGATIVE
Glucose, UA: NEGATIVE mg/dL
Ketones, ur: NEGATIVE mg/dL
Nitrite: NEGATIVE
Protein, ur: NEGATIVE mg/dL
Specific Gravity, Urine: 1.006 (ref 1.005–1.030)
pH: 6 (ref 5.0–8.0)

## 2021-07-18 LAB — LIPASE, BLOOD: Lipase: 28 U/L (ref 11–51)

## 2021-07-18 MED ORDER — LACTATED RINGERS IV BOLUS
1000.0000 mL | Freq: Once | INTRAVENOUS | Status: AC
  Administered 2021-07-18: 1000 mL via INTRAVENOUS

## 2021-07-18 MED ORDER — ONDANSETRON HCL 4 MG/2ML IJ SOLN
4.0000 mg | Freq: Once | INTRAMUSCULAR | Status: AC
  Administered 2021-07-18: 4 mg via INTRAVENOUS
  Filled 2021-07-18: qty 2

## 2021-07-18 MED ORDER — HYDROMORPHONE HCL 2 MG PO TABS
1.0000 mg | ORAL_TABLET | Freq: Four times a day (QID) | ORAL | 0 refills | Status: DC | PRN
Start: 2021-07-18 — End: 2021-07-24

## 2021-07-18 MED ORDER — ONDANSETRON 4 MG PO TBDP: 4.0000 mg | ORAL_TABLET | Freq: Three times a day (TID) | ORAL | 0 refills | Status: DC | PRN

## 2021-07-18 MED ORDER — FENTANYL CITRATE PF 50 MCG/ML IJ SOSY
50.0000 ug | PREFILLED_SYRINGE | Freq: Once | INTRAMUSCULAR | Status: AC
  Administered 2021-07-18: 50 ug via INTRAVENOUS
  Filled 2021-07-18: qty 1

## 2021-07-18 NOTE — ED Triage Notes (Signed)
Pt reports that she was seen here on 6/3 for same issues, continues to have rt lower back pain.

## 2021-07-18 NOTE — ED Provider Triage Note (Signed)
  Emergency Medicine Provider Triage Evaluation Note  Sally Wilson , a 71 y.o.female,  was evaluated in triage.  Pt complains of right-sided flank pain/back pain.  She states that the pain has been ongoing for the past week.  She was seen here on 07/14/2021 for similar symptoms and prescribed prednisone, baclofen, and Percocet.  She states that this is not helped her pain much.  Reports significant difficulty walking.   Review of Systems  Positive: Right side flank pain Negative: Denies fever, chest pain, vomiting  Physical Exam   Vitals:   07/18/21 1211  BP: (!) 123/106  Pulse: (!) 107  Resp: 19  Temp: 98.1 F (36.7 C)  SpO2: 94%   Gen:   Awake, no distress   Resp:  Normal effort  MSK:   Moves extremities without difficulty  Other:    Medical Decision Making  Given the patient's initial medical screening exam, the following diagnostic evaluation has been ordered. The patient will be placed in the appropriate treatment space, once one is available, to complete the evaluation and treatment. I have discussed the plan of care with the patient and I have advised the patient that an ED physician or mid-level practitioner will reevaluate their condition after the test results have been received, as the results may give them additional insight into the type of treatment they may need.    Diagnostics: Labs, UA, CT renal  Treatments: none immediately   Teodoro Spray, [place] 07/18/21 1321

## 2021-07-18 NOTE — Discharge Instructions (Addendum)
Take the prescription meds as directed. Follow-up with urology as discussed.

## 2021-07-18 NOTE — ED Provider Notes (Signed)
[facility] Provider Note    Event Date/Time   First MD Initiated Contact with Patient 07/18/21 1400     (approximate)   History   Back Pain   HPI  Sally Wilson is a 71 y.o. female who presents to the emergency department for treatment and evaluation of right flank pain.  She was evaluated on 07/14/2021 for the same but continues to have pain.  No fever.  No dysuria.  She states that she has been taking medications as prescribed and is now constipated as well.  Past Medical History:  Diagnosis Date   Arthritis    Current tobacco use    Dysplastic nevus 03/08/2021   left upper back paraspinal 2.5cm lat to spine- Excised 04/17/21   IBS (irritable bowel syndrome)       Physical Exam   Triage Vital Signs: ED Triage Vitals  Enc Vitals Group     BP 07/18/21 1211 (!) 123/106     Pulse Rate 07/18/21 1211 (!) 107     Resp 07/18/21 1211 19     Temp 07/18/21 1211 98.1 F (36.7 C)     Temp src --      SpO2 07/18/21 1211 94 %     Weight 07/18/21 1210 132 lb 4.4 oz (60 kg)     Height 07/18/21 1210 '5\' 6"'$  (1.676 m)     Head Circumference --      Peak Flow --      Pain Score 07/18/21 1210 10     Pain Loc --      Pain Edu? --      Excl. in [place]? --     Most recent vital signs: Vitals:   07/18/21 1211 07/18/21 1630  BP: (!) 123/106 131/71  Pulse: (!) 107 71  Resp: 19 18  Temp: 98.1 F (36.7 C)   SpO2: 94% 100%    General: Awake, no distress.  CV:  Good peripheral perfusion.  Resp:  Normal effort.  Abd:  No distention.  Other:  Right-sided CVA tenderness.   ED Results / Procedures / Treatments   Labs (all labs ordered are listed, but only abnormal results are displayed) Labs Reviewed  COMPREHENSIVE METABOLIC PANEL - Abnormal; Notable for the following components:      Result Value   Potassium 3.1 (*)    Glucose, Bld 126 (*)    All other components within normal limits  CBC WITH DIFFERENTIAL/PLATELET - Abnormal; Notable for the  following components:   Hemoglobin 15.5 (*)    All other components within normal limits  URINALYSIS, ROUTINE W REFLEX MICROSCOPIC - Abnormal; Notable for the following components:   Color, Urine YELLOW (*)    APPearance CLEAR (*)    Hgb urine dipstick SMALL (*)    Leukocytes,Ua SMALL (*)    Bacteria, UA RARE (*)    All other components within normal limits  LIPASE, BLOOD     EKG  Not indicated   RADIOLOGY  6 mm right lower lobe renal calculus with no hydronephrosis.  I have independently reviewed and interpreted imaging as well as reviewed report from radiology.  PROCEDURES:  Critical Care performed: No  Procedures   MEDICATIONS ORDERED IN ED:  Medications  lactated ringers bolus 1,000 mL (0 mLs Intravenous Stopped 07/18/21 1633)  ondansetron (ZOFRAN) injection 4 mg (4 mg Intravenous Given 07/18/21 1432)  fentaNYL (SUBLIMAZE) injection 50 mcg (50 mcg Intravenous Given 07/18/21 1432)     IMPRESSION / MDM / ASSESSMENT  AND PLAN / ED COURSE   I reviewed the triage vital signs and the nursing notes.                              Differential diagnosis includes, but is not limited to: Lumbosacral strain, pyelonephritis, kidney stone  Patient's presentation is most consistent with acute presentation with potential threat to life or bodily function.  71 year old female presenting to the emergency department for treatment and evaluation of continued right flank pain.  Labs are reassuring with the exception of a mild hypokalemia with a potassium of 3.1.  White blood cell count is normal.  Urinalysis shows small amount of hemoglobin, small amount of leukocytes and rare bacteria with 0-5 red blood cells.  CT scan does show a 6 mm nonobstructing stone with mild hydronephrosis.  Results were discussed with the patient.  Pain controlled after fentanyl.  Patient will be discharged home.  She states that the Percocet that was prescribed at her last visit 4 days ago is causing  rebound headaches and constipation.  She states that in the past, she has been prescribed very low-dose hydromorphone and has not had any adverse reaction.  She was given very strict warnings regarding opioid use and advised of the risk of overdose and death if she takes this in addition to the Percocet prescribed.  She states that she is aware of these risks and does not plan on taking any additional Percocet.  6 tablets were submitted to her pharmacy and she was instructed to take one half every 6 hours only for severe pain.  She will be discharged home and encouraged to follow-up with urology if not improving over the next few days.  She is to return to the emergency department for symptoms that change or worsen if unable to schedule an appointment with primary care or the urology.     FINAL CLINICAL IMPRESSION(S) / ED DIAGNOSES   Final diagnoses:  Renal calculus, right       Note:  This document was prepared using Dragon voice recognition software and may include unintentional dictation errors.   Victorino Dike, FNP 07/19/21 1209    Naaman Plummer, MD 07/20/21 [phone]

## 2021-07-19 ENCOUNTER — Emergency Department: Payer: Medicare Other

## 2021-07-19 ENCOUNTER — Other Ambulatory Visit: Payer: Self-pay

## 2021-07-19 ENCOUNTER — Inpatient Hospital Stay
Admission: EM | Admit: 2021-07-19 | Discharge: 2021-07-24 | DRG: 537 | Disposition: A | Discharge status: Skilled Nursing Facility | Payer: Medicare Other | Attending: Internal Medicine | Admitting: Internal Medicine

## 2021-07-19 DIAGNOSIS — F32A Depression, unspecified: Secondary | ICD-10-CM | POA: Diagnosis present

## 2021-07-19 DIAGNOSIS — M545 Low back pain, unspecified: Principal | ICD-10-CM

## 2021-07-19 DIAGNOSIS — Z881 Allergy status to other antibiotic agents status: Secondary | ICD-10-CM

## 2021-07-19 DIAGNOSIS — X58XXXA Exposure to other specified factors, initial encounter: Secondary | ICD-10-CM | POA: Diagnosis present

## 2021-07-19 DIAGNOSIS — M5136 Other intervertebral disc degeneration, lumbar region: Secondary | ICD-10-CM | POA: Diagnosis present

## 2021-07-19 DIAGNOSIS — G8929 Other chronic pain: Secondary | ICD-10-CM | POA: Diagnosis not present

## 2021-07-19 DIAGNOSIS — M25551 Pain in right hip: Secondary | ICD-10-CM | POA: Diagnosis not present

## 2021-07-19 DIAGNOSIS — F419 Anxiety disorder, unspecified: Secondary | ICD-10-CM | POA: Diagnosis present

## 2021-07-19 DIAGNOSIS — Z720 Tobacco use: Secondary | ICD-10-CM | POA: Diagnosis present

## 2021-07-19 DIAGNOSIS — K589 Irritable bowel syndrome without diarrhea: Secondary | ICD-10-CM | POA: Diagnosis present

## 2021-07-19 DIAGNOSIS — R002 Palpitations: Secondary | ICD-10-CM | POA: Diagnosis present

## 2021-07-19 DIAGNOSIS — F17210 Nicotine dependence, cigarettes, uncomplicated: Secondary | ICD-10-CM | POA: Diagnosis present

## 2021-07-19 DIAGNOSIS — M549 Dorsalgia, unspecified: Secondary | ICD-10-CM | POA: Diagnosis not present

## 2021-07-19 DIAGNOSIS — S76011A Strain of muscle, fascia and tendon of right hip, initial encounter: Principal | ICD-10-CM | POA: Diagnosis present

## 2021-07-19 DIAGNOSIS — K920 Hematemesis: Secondary | ICD-10-CM | POA: Diagnosis present

## 2021-07-19 DIAGNOSIS — E876 Hypokalemia: Secondary | ICD-10-CM | POA: Diagnosis present

## 2021-07-19 DIAGNOSIS — E538 Deficiency of other specified B group vitamins: Secondary | ICD-10-CM | POA: Diagnosis present

## 2021-07-19 DIAGNOSIS — N200 Calculus of kidney: Secondary | ICD-10-CM | POA: Diagnosis present

## 2021-07-19 DIAGNOSIS — Z79899 Other long term (current) drug therapy: Secondary | ICD-10-CM

## 2021-07-19 DIAGNOSIS — K5900 Constipation, unspecified: Secondary | ICD-10-CM | POA: Diagnosis present

## 2021-07-19 DIAGNOSIS — N1330 Unspecified hydronephrosis: Secondary | ICD-10-CM | POA: Diagnosis present

## 2021-07-19 DIAGNOSIS — Z885 Allergy status to narcotic agent status: Secondary | ICD-10-CM

## 2021-07-19 DIAGNOSIS — D509 Iron deficiency anemia, unspecified: Secondary | ICD-10-CM | POA: Diagnosis present

## 2021-07-19 DIAGNOSIS — M1611 Unilateral primary osteoarthritis, right hip: Secondary | ICD-10-CM | POA: Diagnosis present

## 2021-07-19 DIAGNOSIS — K5730 Diverticulosis of large intestine without perforation or abscess without bleeding: Secondary | ICD-10-CM | POA: Diagnosis present

## 2021-07-19 DIAGNOSIS — Z79891 Long term (current) use of opiate analgesic: Secondary | ICD-10-CM

## 2021-07-19 DIAGNOSIS — Z716 Tobacco abuse counseling: Secondary | ICD-10-CM

## 2021-07-19 HISTORY — DX: Calculus of kidney: N20.0

## 2021-07-19 LAB — BASIC METABOLIC PANEL
Anion gap: 6 (ref 5–15)
BUN: 47 mg/dL — ABNORMAL HIGH (ref 8–23)
CO2: 27 mmol/L (ref 22–32)
Calcium: 8.5 mg/dL — ABNORMAL LOW (ref 8.9–10.3)
Chloride: 104 mmol/L (ref 98–111)
Creatinine, Ser: 0.76 mg/dL (ref 0.44–1.00)
GFR, Estimated: >60 mL/min (ref >60)
Glucose, Bld: 126 mg/dL — ABNORMAL HIGH (ref 70–99)
Potassium: 3.3 mmol/L — ABNORMAL LOW (ref 3.5–5.1)
Sodium: 137 mmol/L (ref 135–145)

## 2021-07-19 LAB — WET PREP, GENITAL
Clue Cells Wet Prep HPF POC: NONE SEEN
Sperm: NONE SEEN
Trich, Wet Prep: NONE SEEN
WBC, Wet Prep HPF POC: >=10 — AB (ref <10)
Yeast Wet Prep HPF POC: NONE SEEN

## 2021-07-19 LAB — CHLAMYDIA/NGC RT PCR (ARMC ONLY)
Chlamydia Tr: NOT DETECTED
N gonorrhoeae: NOT DETECTED

## 2021-07-19 LAB — CBC
HCT: 37.8 % (ref 36.0–46.0)
Hemoglobin: 12.8 g/dL (ref 12.0–15.0)
MCH: 30.1 pg (ref 26.0–34.0)
MCHC: 33.9 g/dL (ref 30.0–36.0)
MCV: 88.9 fL (ref 80.0–100.0)
Platelets: 167 10*3/uL (ref 150–400)
RBC: 4.25 MIL/uL (ref 3.87–5.11)
RDW: 12.4 % (ref 11.5–15.5)
WBC: 10.7 10*3/uL — ABNORMAL HIGH (ref 4.0–10.5)
nRBC: 0 % (ref 0.0–0.2)

## 2021-07-19 LAB — HEPATIC FUNCTION PANEL
ALT: 17 U/L (ref 0–44)
AST: 11 U/L — ABNORMAL LOW (ref 15–41)
Albumin: 3.3 g/dL — ABNORMAL LOW (ref 3.5–5.0)
Alkaline Phosphatase: 43 U/L (ref 38–126)
Bilirubin, Direct: 0.1 mg/dL (ref 0.0–0.2)
Indirect Bilirubin: 0.4 mg/dL (ref 0.3–0.9)
Total Bilirubin: 0.5 mg/dL (ref 0.3–1.2)
Total Protein: 5.7 g/dL — ABNORMAL LOW (ref 6.5–8.1)

## 2021-07-19 LAB — LIPASE, BLOOD: Lipase: 25 U/L (ref 11–51)

## 2021-07-19 MED ORDER — KETOROLAC TROMETHAMINE 15 MG/ML IJ SOLN
15.0000 mg | Freq: Once | INTRAMUSCULAR | Status: AC
Start: 2021-07-19 — End: 2021-07-19
  Administered 2021-07-19: 15 mg via INTRAVENOUS
  Filled 2021-07-19: qty 1

## 2021-07-19 MED ORDER — PAROXETINE HCL ER 12.5 MG PO TB24
25.0000 mg | ORAL_TABLET | Freq: Every day | ORAL | Status: DC
  Administered 2021-07-20 – 2021-07-24 (×5): 25 mg via ORAL
  Filled 2021-07-19 (×5): qty 2

## 2021-07-19 MED ORDER — HYDROMORPHONE HCL 1 MG/ML IJ SOLN
1.0000 mg | Freq: Once | INTRAMUSCULAR | Status: AC
  Administered 2021-07-19: 1 mg via INTRAVENOUS
  Filled 2021-07-19: qty 1

## 2021-07-19 MED ORDER — PANTOPRAZOLE SODIUM 40 MG IV SOLR
40.0000 mg | Freq: Once | INTRAVENOUS | Status: AC
  Administered 2021-07-19: 40 mg via INTRAVENOUS
  Filled 2021-07-19: qty 10

## 2021-07-19 MED ORDER — SODIUM CHLORIDE 0.9% FLUSH
3.0000 mL | Freq: Two times a day (BID) | INTRAVENOUS | Status: DC
  Administered 2021-07-19 – 2021-07-24 (×9): 3 mL via INTRAVENOUS

## 2021-07-19 MED ORDER — LIDOCAINE 5 % EX PTCH
1.0000 | MEDICATED_PATCH | CUTANEOUS | Status: DC
  Administered 2021-07-19 – 2021-07-23 (×4): 1 via TRANSDERMAL
  Filled 2021-07-19 (×5): qty 1

## 2021-07-19 MED ORDER — NICOTINE 14 MG/24HR TD PT24
14.0000 mg | MEDICATED_PATCH | Freq: Every day | TRANSDERMAL | Status: DC
  Administered 2021-07-21 – 2021-07-24 (×4): 14 mg via TRANSDERMAL
  Filled 2021-07-19 (×6): qty 1

## 2021-07-19 MED ORDER — HYDROMORPHONE HCL 2 MG PO TABS
1.0000 mg | ORAL_TABLET | Freq: Four times a day (QID) | ORAL | Status: DC | PRN
  Administered 2021-07-20 – 2021-07-24 (×8): 1 mg via ORAL
  Filled 2021-07-19 (×10): qty 1

## 2021-07-19 MED ORDER — HEPARIN SODIUM (PORCINE) 5000 UNIT/ML IJ SOLN
5000.0000 [IU] | Freq: Three times a day (TID) | INTRAMUSCULAR | Status: DC
  Administered 2021-07-19 – 2021-07-20 (×3): 5000 [IU] via SUBCUTANEOUS
  Filled 2021-07-19 (×3): qty 1

## 2021-07-19 MED ORDER — ACETAMINOPHEN 325 MG PO TABS: 650.0000 mg | ORAL_TABLET | Freq: Four times a day (QID) | ORAL | Status: DC | PRN

## 2021-07-19 MED ORDER — SODIUM CHLORIDE 0.9 % IV SOLN: INTRAVENOUS | Status: AC

## 2021-07-19 MED ORDER — KETOROLAC TROMETHAMINE 30 MG/ML IJ SOLN: 30.0000 mg | Freq: Once | INTRAMUSCULAR | Status: DC

## 2021-07-19 MED ORDER — FLEET ENEMA 7-19 GM/118ML RE ENEM
1.0000 | ENEMA | Freq: Once | RECTAL | Status: AC
  Administered 2021-07-19: 1 via RECTAL

## 2021-07-19 MED ORDER — KETOROLAC TROMETHAMINE 30 MG/ML IJ SOLN: 15.0000 mg | Freq: Once | INTRAMUSCULAR | Status: DC

## 2021-07-19 MED ORDER — ACETAMINOPHEN 650 MG RE SUPP
650.0000 mg | Freq: Four times a day (QID) | RECTAL | Status: DC | PRN
Start: 2021-07-19 — End: 2021-07-24

## 2021-07-19 MED ORDER — PANTOPRAZOLE SODIUM 40 MG IV SOLR
40.0000 mg | Freq: Two times a day (BID) | INTRAVENOUS | Status: DC
  Administered 2021-07-19 – 2021-07-21 (×4): 40 mg via INTRAVENOUS
  Filled 2021-07-19 (×4): qty 10

## 2021-07-19 MED ORDER — SODIUM CHLORIDE 0.9 % IV BOLUS
1000.0000 mL | Freq: Once | INTRAVENOUS | Status: AC
  Administered 2021-07-19: 1000 mL via INTRAVENOUS

## 2021-07-19 MED ORDER — IOHEXOL 300 MG/ML  SOLN
100.0000 mL | Freq: Once | INTRAMUSCULAR | Status: AC | PRN
  Administered 2021-07-19: 100 mL via INTRAVENOUS

## 2021-07-19 MED ORDER — ACETAMINOPHEN 500 MG PO TABS
1000.0000 mg | ORAL_TABLET | Freq: Once | ORAL | Status: DC
  Filled 2021-07-19: qty 2

## 2021-07-19 NOTE — H&P (Signed)
History and Physical    Patient: Sally Wilson ZES:[id number] DOB: 1950-03-23 DOA: 07/19/2021 DOS: the patient was seen and examined on 07/20/2021 PCP: Idelle Crouch, MD  Patient coming from: Home  Chief Complaint:  Chief Complaint  Patient presents with   Flank Pain   HPI:  Patient is a 71 year old Caucasian female with past medical history of IBS, tobacco abuse arthritis, allergies to Keflex codeine and penicillin.  Chart review shows GI note from September 2022 or patient was seen for colonoscopy as she has a history of adenomatous polyps.  This time patient comes to [place] on 3 June for complaints of back pain that has been going on worse for the past 3 days patient has a kidney stone that is nonobstructive.  Patient has been taking NSAIDs and has been vomiting as well.  On 3 June patient was evaluated and discharged with Percocet baclofen and a prednisone pack.  Patient was seen on 7 June for same complaints with back pain on the right side and right flank pain and continues to have the pain since her visit on third. Patient was found to have a 6 mm right lower lobe renal calculus with no hydronephrosis seen on CT scan.   Patient comes back to [place] today with complaints of back pain flank pain that has been going on and has been persistent and patient has been given steroid and patient has been taking intermittent ibuprofen Aleve, patient reports worsening of the pain when trying to sit upright.  Patient denies any warning signs of incontinence or saddle anesthesia.  Patient also does not report any fevers chills headache blurred vision speech or gait issue chest pain palpitation shortness of breath nausea vomiting abdominal pain otherwise.  Family at bedside asked about her vomiting and having blood in vomitus being coffee ground.  Review of Systems: As mentioned in the history of present illness. All other systems reviewed and are negative. Past Medical History:  Diagnosis Date    Arthritis    Current tobacco use    Dysplastic nevus 03/08/2021   left upper back paraspinal 2.5cm lat to spine- Excised 04/17/21   IBS (irritable bowel syndrome)    Kidney stone    Past Surgical History:  Procedure Laterality Date   BREAST BIOPSY Right 2005?   poss stereotatic bx neg   COLONOSCOPY WITH PROPOFOL N/A 11/02/2020   Procedure: COLONOSCOPY WITH PROPOFOL;  Surgeon: Lucilla Lame, MD;  Location: [facility] ENDOSCOPY;  Service: Endoscopy;  Laterality: N/A;   TUBAL LIGATION     Social History:  reports that she has been smoking. She has never used smokeless tobacco. She reports that she does not drink alcohol and does not use drugs.  Allergies  Allergen Reactions   Cephalexin Swelling    Thrush of mouth and genitalia. Swelling Stomach cramping. Thrush of mouth and genitalia. Swelling Stomach cramping. Thrush of mouth and genitalia. Swelling Stomach cramping.   Codeine     Other reaction(s): Other (See Comments)   Penicillin G     Other reaction(s): Other (See Comments)    Family History  Problem Relation Age of Onset   Breast cancer Maternal Aunt    Breast cancer Maternal Aunt     Prior to Admission medications   Medication Sig Start Date End Date Taking? Authorizing Provider  baclofen (LIORESAL) 10 MG tablet Take 1 tablet (10 mg total) by mouth 3 (three) times daily for 7 days. 07/14/21 07/21/21  Fisher, Linden Dolin, PA-C  clonazePAM (KLONOPIN) 1 MG  tablet Take 1 mg by mouth daily.  02/24/15   [provider]  HYDROmorphone (DILAUDID) 2 MG tablet Take 0.5 tablets (1 mg total) by mouth every 6 (six) hours as needed for up to 3 days for severe pain. 07/18/21 07/21/21  Triplett, Johnette Abraham B, FNP  mometasone (ELOCON) 0.1 % cream Apply 1 application topically daily as needed (Rash). Qd to aa right ear prn flares 03/08/21   Ralene Bathe, MD  mupirocin ointment (BACTROBAN) 2 % Apply 1 application. topically daily. Qd to excision site 04/17/21   Ralene Bathe, MD  ondansetron  (ZOFRAN-ODT) 4 MG disintegrating tablet Take 1 tablet (4 mg total) by mouth every 8 (eight) hours as needed for nausea or vomiting. 07/18/21   Triplett, Cari B, FNP  PAXIL CR 25 MG 24 hr tablet Take 25 mg by mouth daily.  02/15/15   [provider]  predniSONE (STERAPRED UNI-PAK 21 TAB) 10 MG (21) TBPK tablet Take 6 pills on day one then decrease by 1 pill each day 07/14/21   Versie Starks, PA-C  traZODone (DESYREL) 100 MG tablet Take 100 mg by mouth at bedtime.  02/24/15   [provider]    Physical Exam: Vitals:   07/19/21 1309 07/19/21 1452 07/20/21 0110  BP: 132/83 134/80 97/69  Pulse: 77 78 69  Resp: '16 16 15  '$ Temp: 98.7 F (37.1 C)    TempSrc: Oral    SpO2: 95% 97% 96%   Physical Exam Vitals and nursing note reviewed.  Constitutional:      General: She is not in acute distress.    Appearance: Normal appearance. She is not ill-appearing, toxic-appearing or diaphoretic.  HENT:     Head: Normocephalic and atraumatic.     Right Ear: Hearing and external ear normal.     Left Ear: Hearing and external ear normal.     Nose: Nose normal. No nasal deformity.     Mouth/Throat:     Lips: Pink.     Mouth: Mucous membranes are moist.     Tongue: No lesions.     Pharynx: Oropharynx is clear.  Eyes:     Extraocular Movements: Extraocular movements intact.     Pupils: Pupils are equal, round, and reactive to light.  Neck:     Vascular: No carotid bruit.  Cardiovascular:     Rate and Rhythm: Normal rate and regular rhythm.     Pulses: Normal pulses.     Heart sounds: Normal heart sounds.  Pulmonary:     Effort: Pulmonary effort is normal.     Breath sounds: Normal breath sounds.  Abdominal:     General: Bowel sounds are normal. There is no distension.     Palpations: Abdomen is soft. There is no mass.     Tenderness: There is no abdominal tenderness. There is no guarding.     Hernia: No hernia is present.  Musculoskeletal:        General: Tenderness present. No  deformity or signs of injury.       Back:     Right hip: Tenderness present.     Right lower leg: No edema.     Left lower leg: No edema.       Legs:     Comments: SLR negative.   Skin:    General: Skin is warm.  Neurological:     General: No focal deficit present.     Mental Status: She is alert and oriented to person, place, and  time.     Cranial Nerves: Cranial nerves 2-12 are intact.     Motor: Motor function is intact.  Psychiatric:        Attention and Perception: Attention normal.        Mood and Affect: Mood normal.        Speech: Speech normal.        Behavior: Behavior normal. Behavior is cooperative.        Cognition and Memory: Cognition normal.     Data Reviewed: Results for orders placed or performed during the hospital encounter of 07/19/21 (from the past 24 hour(s))  Basic metabolic panel     Status: Abnormal   Collection Time: 07/19/21  1:10 PM  Result Value Ref Range   Sodium 137 135 - 145 mmol/L   Potassium 3.3 (L) 3.5 - 5.1 mmol/L   Chloride 104 98 - 111 mmol/L   CO2 27 22 - 32 mmol/L   Glucose, Bld 126 (H) 70 - 99 mg/dL   BUN 47 (H) 8 - 23 mg/dL   Creatinine, Ser 0.76 0.44 - 1.00 mg/dL   Calcium 8.5 (L) 8.9 - 10.3 mg/dL   GFR, Estimated >60 >60 mL/min   Anion gap 6 5 - 15  CBC     Status: Abnormal   Collection Time: 07/19/21  1:10 PM  Result Value Ref Range   WBC 10.7 (H) 4.0 - 10.5 K/uL   RBC 4.25 3.87 - 5.11 MIL/uL   Hemoglobin 12.8 12.0 - 15.0 g/dL   HCT 37.8 36.0 - 46.0 %   MCV 88.9 80.0 - 100.0 fL   MCH 30.1 26.0 - 34.0 pg   MCHC 33.9 30.0 - 36.0 g/dL   RDW 12.4 11.5 - 15.5 %   Platelets 167 150 - 400 K/uL   nRBC 0.0 0.0 - 0.2 %  Hepatic function panel     Status: Abnormal   Collection Time: 07/19/21  1:10 PM  Result Value Ref Range   Total Protein 5.7 (L) 6.5 - 8.1 g/dL   Albumin 3.3 (L) 3.5 - 5.0 g/dL   AST 11 (L) 15 - 41 U/L   ALT 17 0 - 44 U/L   Alkaline Phosphatase 43 38 - 126 U/L   Total Bilirubin 0.5 0.3 - 1.2 mg/dL    Bilirubin, Direct 0.1 0.0 - 0.2 mg/dL   Indirect Bilirubin 0.4 0.3 - 0.9 mg/dL  Lipase, blood     Status: None   Collection Time: 07/19/21  1:10 PM  Result Value Ref Range   Lipase 25 11 - 51 U/L  Chlamydia/NGC rt PCR ([facility] only)     Status: None   Collection Time: 07/19/21  6:33 PM   Specimen: Cervical/Vaginal swab  Result Value Ref Range   Specimen source GC/Chlam ENDOC    Chlamydia Tr NOT DETECTED NOT DETECTED   N gonorrhoeae NOT DETECTED NOT DETECTED  Wet prep, genital     Status: Abnormal   Collection Time: 07/19/21  6:33 PM   Specimen: Cervical/Vaginal swab  Result Value Ref Range   Yeast Wet Prep HPF POC NONE SEEN NONE SEEN   Trich, Wet Prep NONE SEEN NONE SEEN   Clue Cells Wet Prep HPF POC NONE SEEN NONE SEEN   WBC, Wet Prep HPF POC >=10 (A) <10   Sperm NONE SEEN      Assessment and Plan: * Back pain Patient has been coming to the hospital with she initially mentioned was back pain on physical exam it is actually  right hip pain that is been going on chronically for a while.  Patient is a thin frail appearing female in discussed with her that she probably has arthritis and osteoporosis and she indeed does have arthritis affecting her hip with an abnormal MRI. Patient denies any saddle anesthesia incontinence . Discussed with patient to avoid NSAIDs and use Tylenol for her arthritis. Observation admission and physical therapy consult.  Hip pain, chronic, right Patient presenting with back pain and on physical exam is really found to have right-sided hip pain. Patient also reports right groin pain chronically that she has been taking Aleve for her. Straight leg raise test is negative. IMPRESSION: No evidence of acute hip fracture.   Moderate right hip osteoarthritis with degenerative superior labral tearing anteriorly and posteriorly.   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 Mild peritrochanteric edema without significant bursitis.   Mild  findings of ischiofemoral impingement bilaterally. Discussed with patient to seek orthopedic care and goals of management to prevent worsening of her existing condition and need for hip replacement in the future. Again we will get physical therapy and outpatient orthopedic referral.  Also get a G D-dimer if positive in her case we will consider DVT although evaluation so far all indicates to musculoskeletal origin of her presentation of right hip pain.   Hypokalemia Replace and follow levels.  Coffee ground emesis Discussed with patient that coffee-ground emesis is related to her taking Aleve for her arthritis pain and to refrain from any NSAIDs. Her hemoglobin is currently stable. No rectal bleeding or melena. IV PPI and PPI upon discharge. Outpatient GI consult if stable unless hemoglobin drops or patient becomes symptomatic and we will request inpatient GI consult. Most recent colonoscopy in 2022- Dr.Wohl: - Eight 3 to 8 mm polyps in the cecum, removed with a cold snare. Resected and retrieved. - One 5 mm polyp in the ascending colon, removed with a cold snare. Resected and retrieved. - Two 4 to 5 mm polyps in the transverse colon, removed with a cold snare. Resected and retrieved. - One 3 mm polyp in the sigmoid colon, removed with a cold biopsy forceps. Resected and retrieved. - One 6 mm polyp in the sigmoid colon, removed with a cold snare. Resected and retrieved. - Diverticulosis in the sigmoid colon. - Non-bleeding internal hemorrhoids. No record of EGD.  AM team to refer for gi followup for UGI eval of coffee ground emesis.   Heart palpitations Chart review shows patient had a history of heart palpitations. Of note patient does not report any symptoms today. Patient has been seen by cardiology and also has had a cardiac monitor evaluation by Dr. Rockey Situ which was said to be normal.  Tobacco abuse Nicotine patch. Counseled patient on tobacco cessation as it is associated with  arthritis and osteoporosis.    Advance Care Planning:    Code Status: Full Code   Consults:  None    Family Communication:  Karl Ito (Sister)  [phone] (Home Phone)  Severity of Illness: The appropriate patient status for this patient is OBSERVATION. Observation status is judged to be reasonable and necessary in order to provide the required intensity of service to ensure the patient's safety. The patient's presenting symptoms, physical exam findings, and initial radiographic and laboratory data in the context of their medical condition is felt to place them at decreased risk for further clinical deterioration. Furthermore, it is anticipated that the patient will be medically stable for discharge from the hospital within 2 midnights  of admission.   Author: Para Skeans, MD 07/20/2021 1:51 AM  For on call review [email or web address].

## 2021-07-19 NOTE — ED Triage Notes (Signed)
Pt comes into the ED via EMS from home with c/o right flank pain with nausea, states she was here on Saturday and dx with a pulled muscle and then returned on Tuesday and dx with a kidney stones, states she has been taking the muscle relaxer's, percocet and a dilaudid for the pain but states her pain is uncontrolled

## 2021-07-19 NOTE — ED Provider Notes (Addendum)
[facility] Provider Note    Event Date/Time   First MD Initiated Contact with Patient 07/19/21 1451     (approximate)   History   Flank Pain   HPI  Sally Wilson is a 71 y.o. female with known kidney stone seen yesterday who comes in with flank pain.  Patient was discharged with 1 mg of hydrocodone every 6 hours as well as Zofran.  Patient reports the hydrocodone did not seem to help at all.  She reports that she thinks she was on a lot higher dose when she needed it previously.  She reports the pain is worse when she tries to sit up and move.  She reports because of the pain she cannot make it to the bathroom or get up out of bed.  She denies any numbness or weakness in her legs.  Denies any bladder or bowel incontinence just more the pain that prevents her from getting to the bathroom.  No saddle anesthesia.  She denies having symptoms like this previously.  She denies any fevers or urinary symptoms.  She does report some generalized abdominal discomfort as well.  She reports feeling dehydrated secondary to not being able to keep medications down due to the pain she denies any chest pain, shortness of breath  I reviewed the note from 6/3 where patient came in with what sounded like musculoskeletal back pain and discharged on oxycodone and steroid taper.  She then returned yesterday and had a CT renal that did show a kidney stone inside the kidney and discharged home on Dilaudid.  UA yesterday had a small amount of hemoglobin and leukocytes.  Physical Exam   Triage Vital Signs: ED Triage Vitals [07/19/21 1309]  Enc Vitals Group     BP 132/83     Pulse Rate 77     Resp 16     Temp 98.7 F (37.1 C)     Temp Source Oral     SpO2 95 %     Weight      Height      Head Circumference      Peak Flow      Pain Score      Pain Loc      Pain Edu?      Excl. in [place]?     Most recent vital signs: Vitals:   07/19/21 1309  BP: 132/83  Pulse: 77  Resp: 16   Temp: 98.7 F (37.1 C)  SpO2: 95%     General: Awake, no distress.  CV:  Good peripheral perfusion.  Resp:  Normal effort.  Abd:  No distention.  Other:  Her abdomen is slightly tender throughout without any rebound or guarding.  She has no tenderness with palpation of the back.  She has no L-spine tenderness.  She is got equal strength in her legs and sensations intact in her legs. She has right flank tenderness going down into the right hip with is worse with movement but she stable to move her legs.  ED Results / Procedures / Treatments   Labs (all labs ordered are listed, but only abnormal results are displayed) Labs Reviewed  BASIC METABOLIC PANEL - Abnormal; Notable for the following components:      Result Value   Potassium 3.3 (*)    Glucose, Bld 126 (*)    BUN 47 (*)    Calcium 8.5 (*)    All other components within normal limits  CBC - Abnormal; Notable  for the following components:   WBC 10.7 (*)    All other components within normal limits  URINALYSIS, ROUTINE W REFLEX MICROSCOPIC     RADIOLOGY I have reviewed the the CT personally and interpreted and patient had a 6 mm right lower pole renal calculus.  IMPRESSION: 1. No acute CT findings of the abdomen or pelvis to explain pain. 2. Small nonobstructive calculus of the inferior pole of the right kidney. No left-sided calculi, ureteral calculi, or hydronephrosis. 3. Descending and sigmoid diverticulosis without evidence of acute diverticulitis. 4. Moderate burden of stool throughout the colon and rectum.  PROCEDURES:  Critical Care performed: No  Procedures   MEDICATIONS ORDERED IN ED: Medications  lidocaine (LIDODERM) 5 % 1 patch (1 patch Transdermal Patch Applied 07/19/21 1536)  acetaminophen (TYLENOL) tablet 1,000 mg (1,000 mg Oral Not Given 07/19/21 1536)  sodium phosphate (FLEET) 7-19 GM/118ML enema 1 enema (has no administration in time range)  HYDROmorphone (DILAUDID) injection 1 mg (has no  administration in time range)  ketorolac (TORADOL) 15 MG/ML injection 15 mg (15 mg Intravenous Given 07/19/21 1536)  sodium chloride 0.9 % bolus 1,000 mL (1,000 mLs Intravenous New Bag/Given 07/19/21 1538)  iohexol (OMNIPAQUE) 300 MG/ML solution 100 mL (100 mLs Intravenous Contrast Given 07/19/21 1601)     IMPRESSION / MDM / ASSESSMENT AND PLAN / ED COURSE  I reviewed the triage vital signs and the nursing notes.   Patient's presentation is most consistent with acute presentation with potential threat to life or bodily function.  Differential includes diverticulitis, appendicitis, ovarian torsion, musculoskeletal pain, kidney stone.  I did a CT with contrast to evaluate if the kidney stone has moved at all or to see if there is any other cause of patient's pain.  However does seem somewhat musculoskeletal in nature.  The CT imaging was negative for acute findings of the kidney stone is in the inferior pole of the right kidney.  I explained to patient that this typically does not cause pain and that her pain seems to be more musculoskeletal in nature however she reports no improvement with the Toradol and lidocaine patch.  There is a moderate burden of stool in the colon and rectum and I did a rectal exam and did not feel any stool that I can easily remove.  I discussed with patient and she still been unable to urinate.  She states that she feels like she has the urge but cannot get the urine out.  Given this concern for possible urinary retention this could just be from the stool so we will trial a enema and see if that will help her get the stool out to be able to urinate.  In the meantime of ordered some MRIs of the lumbar area to make sure no evidence of cord compression as well as the right hip given severe pain with trying to move in the lower right back and in the right hip.  There is no warmth or redness to suggest a septic joint but patient does not have any history of being here before for this  previously so I feel like getting MRIs to help rule out anything else acute going on.  We discussed the possibility of STDs given she does report some pain in the low right lower quadrant given she does have a new partner but she denies any vaginal discharge.  We will allow her to self swab.  I did do a finger insertion did not see any discharge and she  had no cervical motion tenderness so seems less likely to be PID  CBC shows slightly elevated white count at 10.7.  BMP shows stable creatinine.  Patient has relief of symptoms with the Dilaudid was able to ambulate to the bathroom.  She did have some urine come out we will get a postvoid bladder scan.  Patient handed off to oncoming team pending MRIs, UA, bladder scan to see if Foley is needed.  8:05 PM patient now vomited up some coffee-ground emesis and was Hemoccult tested and positive for blood.  Unclear if this could be from the steroids causing some gastritis that she was recently on given her severe pain and now with this we will discuss with hospital team for admission.   FINAL CLINICAL IMPRESSION(S) / ED DIAGNOSES   Final diagnoses:  Right-sided low back pain without sciatica, unspecified chronicity     Rx / DC Orders   ED Discharge Orders     None        Note:  This document was prepared using Dragon voice recognition software and may include unintentional dictation errors.   Vanessa Goodwin, MD 07/19/21 Lurena Nida    Vanessa [place], MD 07/19/21 2005

## 2021-07-19 NOTE — Hospital Course (Signed)
Admission requested for abd pain on rt side and pt coming to ed x 3. Initially in Mri and admission delayed d/w patient placement and provider.

## 2021-07-19 NOTE — ED Notes (Signed)
Bladder scan 80cc-89cc

## 2021-07-20 ENCOUNTER — Encounter: Payer: Self-pay | Admitting: Internal Medicine

## 2021-07-20 ENCOUNTER — Observation Stay: Payer: Medicare Other

## 2021-07-20 DIAGNOSIS — M545 Low back pain, unspecified: Secondary | ICD-10-CM

## 2021-07-20 DIAGNOSIS — E876 Hypokalemia: Secondary | ICD-10-CM | POA: Diagnosis present

## 2021-07-20 DIAGNOSIS — M25551 Pain in right hip: Secondary | ICD-10-CM | POA: Diagnosis not present

## 2021-07-20 DIAGNOSIS — K920 Hematemesis: Secondary | ICD-10-CM | POA: Diagnosis not present

## 2021-07-20 DIAGNOSIS — G8929 Other chronic pain: Secondary | ICD-10-CM | POA: Diagnosis present

## 2021-07-20 LAB — CBC
HCT: 30.9 % — ABNORMAL LOW (ref 36.0–46.0)
Hemoglobin: 10.3 g/dL — ABNORMAL LOW (ref 12.0–15.0)
MCH: 30.5 pg (ref 26.0–34.0)
MCHC: 33.3 g/dL (ref 30.0–36.0)
MCV: 91.4 fL (ref 80.0–100.0)
Platelets: 150 10*3/uL (ref 150–400)
RBC: 3.38 MIL/uL — ABNORMAL LOW (ref 3.87–5.11)
RDW: 12.7 % (ref 11.5–15.5)
WBC: 7.6 10*3/uL (ref 4.0–10.5)
nRBC: 0 % (ref 0.0–0.2)

## 2021-07-20 LAB — URINALYSIS, ROUTINE W REFLEX MICROSCOPIC
Bilirubin Urine: NEGATIVE
Glucose, UA: NEGATIVE mg/dL
Ketones, ur: NEGATIVE mg/dL
Nitrite: NEGATIVE
Protein, ur: NEGATIVE mg/dL
Specific Gravity, Urine: 1.03 (ref 1.005–1.030)
pH: 5 (ref 5.0–8.0)

## 2021-07-20 LAB — COMPREHENSIVE METABOLIC PANEL
ALT: 11 U/L (ref 0–44)
AST: 10 U/L — ABNORMAL LOW (ref 15–41)
Albumin: 2.9 g/dL — ABNORMAL LOW (ref 3.5–5.0)
Alkaline Phosphatase: 37 U/L — ABNORMAL LOW (ref 38–126)
Anion gap: 1 — ABNORMAL LOW (ref 5–15)
BUN: 32 mg/dL — ABNORMAL HIGH (ref 8–23)
CO2: 29 mmol/L (ref 22–32)
Calcium: 8.2 mg/dL — ABNORMAL LOW (ref 8.9–10.3)
Chloride: 111 mmol/L (ref 98–111)
Creatinine, Ser: 0.67 mg/dL (ref 0.44–1.00)
GFR, Estimated: >60 mL/min (ref >60)
Glucose, Bld: 100 mg/dL — ABNORMAL HIGH (ref 70–99)
Potassium: 3.2 mmol/L — ABNORMAL LOW (ref 3.5–5.1)
Sodium: 141 mmol/L (ref 135–145)
Total Bilirubin: 0.5 mg/dL (ref 0.3–1.2)
Total Protein: 5.2 g/dL — ABNORMAL LOW (ref 6.5–8.1)

## 2021-07-20 LAB — GAMMA GT: GGT: 11 U/L (ref 7–50)

## 2021-07-20 LAB — MAGNESIUM: Magnesium: 2.1 mg/dL (ref 1.7–2.4)

## 2021-07-20 LAB — D-DIMER, QUANTITATIVE: D-Dimer, Quant: 0.81 ug/mL-FEU — ABNORMAL HIGH (ref 0.00–0.50)

## 2021-07-20 MED ORDER — TRAZODONE HCL 50 MG PO TABS
50.0000 mg | ORAL_TABLET | Freq: Once | ORAL | Status: AC
Start: 2021-07-20 — End: 2021-07-20
  Administered 2021-07-20: 50 mg via ORAL
  Filled 2021-07-20: qty 1

## 2021-07-20 MED ORDER — MORPHINE SULFATE (PF) 2 MG/ML IV SOLN
2.0000 mg | INTRAVENOUS | Status: DC | PRN
  Administered 2021-07-20 – 2021-07-24 (×14): 2 mg via INTRAVENOUS
  Filled 2021-07-20 (×17): qty 1

## 2021-07-20 MED ORDER — CLONAZEPAM 1 MG PO TABS
1.0000 mg | ORAL_TABLET | Freq: Every day | ORAL | Status: DC
  Administered 2021-07-20 – 2021-07-23 (×4): 1 mg via ORAL
  Filled 2021-07-20: qty 2
  Filled 2021-07-20 (×3): qty 1

## 2021-07-20 MED ORDER — POTASSIUM CHLORIDE CRYS ER 20 MEQ PO TBCR
40.0000 meq | EXTENDED_RELEASE_TABLET | Freq: Once | ORAL | Status: AC
Start: 2021-07-20 — End: 2021-07-20
  Administered 2021-07-20: 40 meq via ORAL
  Filled 2021-07-20: qty 2

## 2021-07-20 MED ORDER — ONDANSETRON HCL 4 MG/2ML IJ SOLN
4.0000 mg | Freq: Four times a day (QID) | INTRAMUSCULAR | Status: DC | PRN
  Administered 2021-07-20 – 2021-07-24 (×2): 4 mg via INTRAVENOUS
  Filled 2021-07-20 (×2): qty 2

## 2021-07-20 NOTE — Assessment & Plan Note (Signed)
Nicotine patch. Counseled patient on tobacco cessation as it is associated with arthritis and osteoporosis.

## 2021-07-20 NOTE — Assessment & Plan Note (Signed)
Replete and recheck 

## 2021-07-20 NOTE — Assessment & Plan Note (Addendum)
Patient presenting with back pain and on physical exam is really found to have right-sided hip pain. Patient also reports right groin pain chronically that she has been taking Aleve for her. Straight leg raise test is negative. IMPRESSION: No evidence of acute hip fracture.  Moderate right hip osteoarthritis with degenerative superior labral tearing anteriorly and posteriorly.  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 Mild peritrochanteric edema without significant bursitis.  Mild findings of ischiofemoral impingement bilaterally. Discussed with patient to seek orthopedic care and goals of management to prevent worsening of her existing condition and need for hip replacement in the future. Again we will get physical therapy and outpatient orthopedic referral.  Also get a G D-dimer if positive in her case we will consider DVT although evaluation so far all indicates to musculoskeletal origin of her presentation of right hip pain.

## 2021-07-20 NOTE — Assessment & Plan Note (Addendum)
Chart review shows patient had a history of heart palpitations. Of note patient does not report any symptoms today. Patient has been seen by cardiology and also has had a cardiac monitor evaluation by Dr. Rockey Situ which was said to be normal.

## 2021-07-20 NOTE — Progress Notes (Signed)
Progress Note    Sally Wilson  FBP:[id number] DOB: 27-May-1950  DOA: 07/19/2021 PCP: Idelle Crouch, MD      Brief Narrative:    Medical records reviewed and are as summarized below:  Sally Wilson is a 71 y.o. female with medical history significant for IBS, tobacco use disorder, arthritis, who presented to the hospital because of severe pain on the right hip.  She had previously been to the emergency room on 2 occasions within the past week for similar complaints.  She came back to the hospital because of worsening right hip pain.  She also reported vomiting coffee-ground material.  She said she vomited about 3 times.  The first 2 vomitus was not bloody but the third vomitus looked like coffee-ground material.  No abdominal pain or rectal bleeding.  Of note, she said she haD been taking naproxen 750 mg daily for about 9 days.       Assessment/Plan:   Principal Problem:   Back pain Active Problems:   Hip pain, chronic, right   Tobacco abuse   Heart palpitations   Coffee ground emesis   Hypokalemia   Right hip pain, back pain, moderate right hip arthritis, right gluteus minimus tendon tear, right peritrochanteric edema, degenerative disc disease lumbar spine,, multilevel foraminal canal stenosis from T12-S1: Continue analgesics as needed for pain.  Consult PT and OT.  Consulted Dr. Sabra Heck, orthopedic surgeon, because of severity of pain.  Hematemesis: Probably from repeated vomiting.  This could also be from recent naproxen use.  Continue IV Protonix.  Monitor H&H.  Discontinue heparin subcu for DVT prophylaxis.  Use SCDs for DVT prophylaxis.  Recent colonoscopy in September 2022 showed colonic polyps  Anemia, probably from acute blood loss anemia: Monitor H&H.  Hypokalemia: Replete potassium and monitor levels  Tobacco use disorder continue nicotine patch  Other comorbidities include anxiety, depression   Diet Order             Diet Heart Room service  appropriate? Yes; Fluid consistency: Thin  Diet effective now                            Consultants: Orthopedic surgeon  Procedures: None    Medications:    acetaminophen  1,000 mg Oral Once   clonazePAM  1 mg Oral Daily   heparin  5,000 Units Subcutaneous Q8H   lidocaine  1 patch Transdermal Q24H   nicotine  14 mg Transdermal Daily   pantoprazole (PROTONIX) IV  40 mg Intravenous Q12H   PARoxetine  25 mg Oral Daily   sodium chloride flush  3 mL Intravenous Q12H   Continuous Infusions:  sodium chloride 50 mL/hr at 07/20/21 1634     Anti-infectives (From admission, onward)    None              Family Communication/Anticipated D/C date and plan/Code Status   DVT prophylaxis: heparin injection 5,000 Units Start: 07/19/21 2230     Code Status: Full Code  Family Communication: Plan discussed with her sister at the bedside Disposition Plan: She may have to go to SNF for rehab   Status is: Observation The patient will require care spanning > 2 midnights and should be moved to inpatient because: Severe right hip pain, inability to stand       Subjective:   She complains of severe pain in the right right hip and right buttock.  She said she  is unable to stand on her feet because of excruciating pain in the right hip.  Objective:    Vitals:   07/20/21 1249 07/20/21 1323 07/20/21 1403 07/20/21 1536  BP: 124/81 110/87 125/76 111/68  Pulse: 75 75 71 72  Resp: '16 16 17 18  '$ Temp:   99.1 F (37.3 C) 99 F (37.2 C)  TempSrc:   Oral   SpO2: 99% 98% 98% 98%   No data found.   Intake/Output Summary (Last 24 hours) at 07/20/2021 1653 Last data filed at 07/20/2021 1634 Gross per 24 hour  Intake 801.91 ml  Output --  Net 801.91 ml   There were no vitals filed for this visit.  Exam:  GEN: NAD SKIN: No rash EYES: EOMI ENT: MMM CV: RRR PULM: CTA B ABD: soft, ND, NT, +BS CNS: AAO x 3, non focal EXT: No edema or tenderness MSK: Right  hip tenderness but no erythema or swelling        Data Reviewed:   I have personally reviewed following labs and imaging studies:  Labs: Labs show the following:   Basic Metabolic Panel: Recent Labs  Lab 07/14/21 1039 07/18/21 1348 07/19/21 1310 07/20/21 0607  NA 143 139 137 141  K 3.8 3.1* 3.3* 3.2*  CL 110 104 104 111  CO2 '29 28 27 29  '$ GLUCOSE 120* 126* 126* 100*  BUN 21 18 47* 32*  CREATININE 0.82 0.90 0.76 0.67  CALCIUM 9.1 8.9 8.5* 8.2*  MG  --   --   --  2.1   GFR Estimated Creatinine Clearance: 60.4 mL/min (by C-G formula based on SCr of 0.67 mg/dL). Liver Function Tests: Recent Labs  Lab 07/18/21 1348 07/19/21 1310 07/20/21 0607  AST 17 11* 10*  ALT '19 17 11  '$ ALKPHOS 59 43 37*  BILITOT 0.9 0.5 0.5  PROT 6.5 5.7* 5.2*  ALBUMIN 3.7 3.3* 2.9*   Recent Labs  Lab 07/18/21 1348 07/19/21 1310  LIPASE 28 25   No results for input(s): "AMMONIA" in the last 168 hours. Coagulation profile No results for input(s): "INR", "PROTIME" in the last 168 hours.  CBC: Recent Labs  Lab 07/14/21 1039 07/18/21 1348 07/19/21 1310 07/20/21 0607  WBC 7.4 8.4 10.7* 7.6  NEUTROABS 5.4 6.9  --   --   HGB 13.3 15.5* 12.8 10.3*  HCT 40.9 44.8 37.8 30.9*  MCV 93.0 89.4 88.9 91.4  PLT 151 159 167 150   Cardiac Enzymes: No results for input(s): "CKTOTAL", "CKMB", "CKMBINDEX", "TROPONINI" in the last 168 hours. BNP (last 3 results) No results for input(s): "PROBNP" in the last 8760 hours. CBG: No results for input(s): "GLUCAP" in the last 168 hours. D-Dimer: Recent Labs    07/20/21 0607  DDIMER 0.81*   Hgb A1c: No results for input(s): "HGBA1C" in the last 72 hours. Lipid Profile: No results for input(s): "CHOL", "HDL", "LDLCALC", "TRIG", "CHOLHDL", "LDLDIRECT" in the last 72 hours. Thyroid function studies: No results for input(s): "TSH", "T4TOTAL", "T3FREE", "THYROIDAB" in the last 72 hours.  Invalid input(s): "FREET3" Anemia work up: No results for  input(s): "VITAMINB12", "FOLATE", "FERRITIN", "TIBC", "IRON", "RETICCTPCT" in the last 72 hours. Sepsis Labs: Recent Labs  Lab 07/14/21 1039 07/18/21 1348 07/19/21 1310 07/20/21 0607  WBC 7.4 8.4 10.7* 7.6    Microbiology Recent Results (from the past 240 hour(s))  Chlamydia/NGC rt PCR ([place] only)     Status: None   Collection Time: 07/19/21  6:33 PM   Specimen: Cervical/Vaginal swab  Result Value  Ref Range Status   Specimen source GC/Chlam ENDOC  Final   Chlamydia Tr NOT DETECTED NOT DETECTED Final   N gonorrhoeae NOT DETECTED NOT DETECTED Final    Comment: (NOTE) This CT/NG assay has not been evaluated in patients with a history of  hysterectomy. Performed at [facility], [address] [place], [place] [address]   Wet prep, genital     Status: Abnormal   Collection Time: 07/19/21  6:33 PM   Specimen: Cervical/Vaginal swab  Result Value Ref Range Status   Yeast Wet Prep HPF POC NONE SEEN NONE SEEN Final   Trich, Wet Prep NONE SEEN NONE SEEN Final   Clue Cells Wet Prep HPF POC NONE SEEN NONE SEEN Final   WBC, Wet Prep HPF POC >=10 (A) <10 Final   Sperm NONE SEEN  Final    Comment: Performed at [facility], [address] [place],  [address]    Procedures and diagnostic studies:  MR LUMBAR SPINE WO CONTRAST  Result Date: 07/19/2021 CLINICAL DATA:  Low back pain, infection suspected, positive xray/CT EXAM: MRI LUMBAR SPINE WITHOUT CONTRAST TECHNIQUE: Multiplanar, multisequence MR imaging of the lumbar spine was performed. No intravenous contrast was administered. COMPARISON:  None Available. FINDINGS: Segmentation:  Standard. Alignment: There is mild retrolisthesis at L2-L3. Grade 1 anterolisthesis at L4-L5. Vertebrae: There are multilevel degenerative endplate changes most prominent at L1-L2 and L2-L3. There is no evidence of acute fracture, discitis, or aggressive osseous lesion. Conus medullaris and cauda equina: Conus extends to the L1-L2  level. Conus and cauda equina appear normal. Paraspinal and other soft tissues: Tiny left renal cyst which requires no follow-up. Lower paraspinal muscle atrophy. Disc levels: T12-L1: Minimal disc bulging. Ligamentum flavum hypertrophy and bilateral facet arthropathy. There is minimal spinal canal narrowing. Mild right neural foraminal stenosis. L1-L2: Minimal disc bulging. Ligamentum flavum hypertrophy and mild facet arthropathy. Minimal spinal canal narrowing. Mild left neural foraminal stenosis. L2-L3: Mild retrolisthesis. Ligament flavum hypertrophy and bilateral facet arthropathy with bilateral facet effusions. Minimal disc bulging and endplate spurring. There is left greater than right subarticular stenosis and contact with the descending left L3 nerve root. There is moderate left neural foraminal stenosis. Mild right neural foraminal stenosis. L3-L4: Minimal asymmetric left disc bulging. Ligamentum flavum hypertrophy and bilateral facet arthropathy. There is mild right subarticular stenosis and contact with the descending right L4 nerve root. There is mild bilateral neural foraminal stenosis. L4-L5: Grade 1 anterolisthesis with minimal disc bulging. Ligamentum flavum hypertrophy and bilateral facet arthropathy with right greater left facet effusions. There is mild spinal canal stenosis with moderate bilateral subarticular stenosis and contact with the descending L5 nerve roots bilaterally. L5-S1: Mild disc bulging. Ligament flavum hypertrophy and bilateral facet arthropathy. No spinal canal stenosis. There is mild-to-moderate bilateral neural foraminal stenosis. IMPRESSION: Multilevel degenerative changes of the lumbar spine, summarized below: T12-L1: Minimal spinal canal narrowing. Mild right neural foraminal stenosis. L2: Minimal spinal canal narrowing. Mild left neural foraminal stenosis. L2-L3: Mild retrolisthesis. Left greater than right subarticular stenosis and contact with the descending left L3 nerve  root. Moderate left and mild right neural foraminal stenosis. L3-L4: Right subarticular stenosis and contact with the descending right L4 nerve root. Mild bilateral neural foraminal stenosis. L4-L5: Grade 1 anterolisthesis. Mild spinal canal stenosis. Moderate bilateral subarticular stenosis and contact with the descending L5 nerve roots bilaterally. L5-S1: Mild-to-moderate bilateral neural foraminal stenosis. No spinal canal stenosis. Electronically Signed   By: Maurine Simmering M.D.   On: 07/19/2021 21:00  MR HIP RIGHT WO CONTRAST  Result Date: 07/19/2021 CLINICAL DATA:  Fracture, hip EXAM: MR OF THE RIGHT HIP WITHOUT CONTRAST TECHNIQUE: Multiplanar, multisequence MR imaging was performed. No intravenous contrast was administered. COMPARISON:  None Available. FINDINGS: Bones: There is no evidence of acute fracture, dislocation or avascular necrosis. No focal bone lesion. Mild degenerative changes of the SI joints. The pubic symphysis is unremarkable. Lower lumbar spine degenerative disc disease, partially visualized. 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 Articular cartilage and labrum Articular cartilage:  Moderate chondrosis. Labrum: There is degenerative superior labral tearing anteriorly and posteriorly. Joint or bursal effusion Joint effusion: No significant hip joint effusion. Bursae: No evidence of trochanteric bursitis. Muscles and tendons Muscles and tendons: There is tendinosis and probable chronic insertional partial tearing of the distal gluteus minimus tendon at the greater trochanter. There is mild peritrochanteric edema. Similar findings along the left hip. There is tendinosis of the proximal hamstrings bilaterally.There is edema within the quadratus femoris muscles bilaterally with narrowed ischiofemoral space. There is some reactive muscle edema in the hip adductors bilaterally and in the right gluteus maximus. There is a benign tiny enchondroma in the left proximal femur.  Other findings Miscellaneous: Colonic diverticulosis. IMPRESSION: No evidence of acute hip fracture. Moderate right hip osteoarthritis with degenerative superior labral tearing anteriorly and posteriorly. 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 Mild peritrochanteric edema without significant bursitis. Mild findings of ischiofemoral impingement bilaterally. Electronically Signed   By: Maurine Simmering M.D.   On: 07/19/2021 20:50   CT ABDOMEN PELVIS W CONTRAST  Result Date: 07/19/2021 CLINICAL DATA:  Flank pain EXAM: CT ABDOMEN AND PELVIS WITH CONTRAST TECHNIQUE: Multidetector CT imaging of the abdomen and pelvis was performed using the standard protocol following bolus administration of intravenous contrast. RADIATION DOSE REDUCTION: This exam was performed according to the departmental dose-optimization program which includes automated exposure control, adjustment of the mA and/or kV according to patient size and/or use of iterative reconstruction technique. CONTRAST:  12m OMNIPAQUE IOHEXOL 300 MG/ML  SOLN COMPARISON:  07/18/2021 FINDINGS: Lower chest: No acute abnormality. Hepatobiliary: No solid liver abnormality is seen. No gallstones, gallbladder wall thickening, or biliary dilatation. Pancreas: Unremarkable. No pancreatic ductal dilatation or surrounding inflammatory changes. Spleen: Normal in size without significant abnormality. Adrenals/Urinary Tract: Adrenal glands are unremarkable. Small nonobstructive calculus of the inferior pole of the right kidney. No left-sided calculi, ureteral calculi, or hydronephrosis. Bladder is unremarkable. Stomach/Bowel: Stomach is within normal limits. Appendix appears normal. No evidence of bowel wall thickening, distention, or inflammatory changes. Descending and sigmoid diverticulosis. Moderate burden of stool throughout the colon and rectum. Vascular/Lymphatic: Aortic atherosclerosis. No enlarged abdominal or pelvic lymph nodes. Reproductive:  Small calcified uterine fibroids. Other: No abdominal wall hernia or abnormality. No ascites. Musculoskeletal: No acute or significant osseous findings. IMPRESSION: 1. No acute CT findings of the abdomen or pelvis to explain pain. 2. Small nonobstructive calculus of the inferior pole of the right kidney. No left-sided calculi, ureteral calculi, or hydronephrosis. 3. Descending and sigmoid diverticulosis without evidence of acute diverticulitis. 4. Moderate burden of stool throughout the colon and rectum. Aortic Atherosclerosis (ICD10-I70.0). Electronically Signed   By: ADelanna AhmadiM.D.   On: 07/19/2021 16:24               LOS: 0 days   Doralyn Kirkes  [facility]   Pager on [email or web address] If 7PM-7AM, please contact night-coverage at [email or web address]     07/20/2021, 4:53 PM

## 2021-07-20 NOTE — Evaluation (Signed)
Physical Therapy Evaluation Patient Details Name: Sally Wilson MRN: [id number] DOB: [birth year] Today's Date: 07/20/2021  History of Present Illness  Sally Wilson is a 89yoF who comes to [facility] on 07/18/21 with Rt flank pain. similar complaint on 07/14/21. CT scan shows a 6 mm nonobstructing stone with mild hydronephrosis. Other imaging reveals a number of chronic degenerative findings at the Rt hip joint, lumbar spine, and Right hip musculature.  Clinical Impression  Pt admitted with above diagnosis. Pt currently with functional limitations due to the deficits listed below (see "PT Problem List"). Upon entry, pt in bed, awake and agreeable to participate. The pt is alert, pleasant, interactive, and able to provide info regarding prior level of function, both in tolerance and independence. Pt having somewhat improved pain control at rest, but is reluctant to move much due to easy exacerbation of pain. Pt able to stand with 1 assist but is unsteady while up. Pt reports increased unsteadiness on feet since being on heavy pain medications last week. Patient's performance this date reveals decreased ability, independence, and tolerance in performing all basic mobility required for performance of activities of daily living. Pt requires additional DME, close physical assistance, and cues for safe participate in mobility. Pt will benefit from skilled PT intervention to increase independence and safety with basic mobility in preparation for discharge to the venue listed below.           Recommendations for follow up therapy are one component of a multi-disciplinary discharge planning process, led by the attending physician.  Recommendations may be updated based on patient status, additional functional criteria and insurance authorization.  Follow Up Recommendations Skilled nursing-short term rehab (<3 hours/day)    Assistance Recommended at Discharge Intermittent Supervision/Assistance  Patient can return  home with the following  A lot of help with bathing/dressing/bathroom;A lot of help with walking and/or transfers    Equipment Recommendations Rolling walker (2 wheels)  Recommendations for Other Services       Functional Status Assessment Patient has had a recent decline in their functional status and demonstrates the ability to make significant improvements in function in a reasonable and predictable amount of time.     Precautions / Restrictions Precautions Precautions: Fall      Mobility  Bed Mobility Overal bed mobility: Needs Assistance Bed Mobility: Supine to Sit, Sit to Supine     Supine to sit: Min guard Sit to supine: Min guard   General bed mobility comments: poor line awareness, impulsive    Transfers Overall transfer level: Needs assistance Equipment used: 1 person hand held assist Transfers: Sit to/from Stand Sit to Stand: Min assist           General transfer comment: unsteady and in pain, impulsive and poorly attentive to IV    Ambulation/Gait Ambulation/Gait assistance:  (declines at this time due to pain.)                Stairs            Wheelchair Mobility    Modified Rankin (Stroke Patients Only)       Balance                                             Pertinent Vitals/Pain Pain Assessment Pain Assessment: 0-10 Pain Score: 4  Pain Location: RT lateral abdomen posterolateral between iliac crest and ribs (deep  to this area) Pain Intervention(s): Limited activity within patient's tolerance, Monitored during session, Premedicated before session, Repositioned    Home Living Family/patient expects to be discharged to:: Private residence Living Arrangements: Children (adult son who is physical disabled, but could held; SO can be available during evenings) Available Help at Discharge: Family Type of Home: House Home Access: Stairs to enter   [employer] of Steps: 1 step in back   Home  Layout: One level Home Equipment: Wheelchair - manual      Prior Function Prior Level of Function : Driving             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,       Journalist, newspaper        Extremity/Trunk Assessment   Upper Extremity Assessment Upper Extremity Assessment: Overall WFL for tasks assessed    Lower Extremity Assessment Lower Extremity Assessment: Overall WFL for tasks assessed       Communication      Cognition Arousal/Alertness: Awake/alert Behavior During Therapy: Impulsive                                            General Comments      Exercises     Assessment/Plan    PT Assessment Patient needs continued PT services  PT Problem List Decreased strength;Decreased range of motion;Decreased activity tolerance;Decreased balance;Decreased mobility;Decreased coordination;Decreased cognition;Decreased safety awareness;Decreased knowledge of precautions       PT Treatment Interventions DME instruction;Balance training;Gait training;Stair training;Functional mobility training;Therapeutic activities;Therapeutic exercise;Patient/family education    PT Goals (Current goals can be found in the Care Plan section)  Acute Rehab PT Goals Patient Stated Goal: improve her pain PT Goal Formulation: With patient Time For Goal Achievement: 08/03/21 Potential to Achieve Goals: Fair    Frequency Min 2X/week     Co-evaluation               AM-PAC PT "6 Clicks" Mobility  Outcome Measure Help needed turning from your back to your side while in a flat bed without using bedrails?: A Little Help needed moving from lying on your back to sitting on the side of a flat bed without using bedrails?: A Little Help needed moving to and from a bed to a chair (including a wheelchair)?: A Little Help needed standing up from a chair using your arms (e.g., wheelchair or bedside chair)?: A Little Help needed to walk in hospital room?: A Little Help needed  climbing 3-5 steps with a railing? : A Little 6 Click Score: 18    End of Session Equipment Utilized During Treatment: Gait belt Activity Tolerance: Patient tolerated treatment well;No increased pain Patient left: in bed;with nursing/sitter in room;with family/visitor present Nurse Communication: Mobility status PT Visit Diagnosis: Unsteadiness on feet (R26.81);Difficulty in walking, not elsewhere classified (R26.2);Other abnormalities of gait and mobility (R26.89);Muscle weakness (generalized) (M62.81)    Time: 8657-8469 PT Time Calculation (min) (ACUTE ONLY): 24 min   Charges:   PT Evaluation $PT Eval Moderate Complexity: 1 Mod        4:35 PM, 07/20/21 Etta Grandchild, PT, DPT Physical Therapist - [facility]  [phone] (Anthony)    Fawnda Vitullo C 07/20/2021, 4:29 PM

## 2021-07-20 NOTE — Assessment & Plan Note (Addendum)
Discussed with patient that coffee-ground emesis is related to her taking Aleve for her arthritis pain and to refrain from any NSAIDs. Her hemoglobin is currently stable. No rectal bleeding or melena. IV PPI and PPI upon discharge. Outpatient GI consult if stable unless hemoglobin drops or patient becomes symptomatic and we will request inpatient GI consult. Most recent colonoscopy in 2022- Dr.Wohl: - Eight 3 to 8 mm polyps in the cecum, removed with a cold snare. Resected and retrieved. - One 5 mm polyp in the ascending colon, removed with a cold snare. Resected and retrieved. - Two 4 to 5 mm polyps in the transverse colon, removed with a cold snare. Resected and retrieved. - One 3 mm polyp in the sigmoid colon, removed with a cold biopsy forceps. Resected and retrieved. - One 6 mm polyp in the sigmoid colon, removed with a cold snare. Resected and retrieved. - Diverticulosis in the sigmoid colon. - Non-bleeding internal hemorrhoids. No record of EGD.  AM team to refer for gi followup for UGI eval of coffee ground emesis.

## 2021-07-20 NOTE — Assessment & Plan Note (Addendum)
Patient has been coming to the hospital with she initially mentioned was back pain on physical exam it is actually right hip pain that is been going on chronically for a while.  Patient is a thin frail appearing female in discussed with her that she probably has arthritis and osteoporosis and she indeed does have arthritis affecting her hip with an abnormal MRI. Patient denies any saddle anesthesia incontinence . Discussed with patient to avoid NSAIDs and use Tylenol for her arthritis. Observation admission and physical therapy consult.

## 2021-07-21 DIAGNOSIS — S76011A Strain of muscle, fascia and tendon of right hip, initial encounter: Secondary | ICD-10-CM | POA: Diagnosis present

## 2021-07-21 DIAGNOSIS — Z79899 Other long term (current) drug therapy: Secondary | ICD-10-CM | POA: Diagnosis not present

## 2021-07-21 DIAGNOSIS — Z881 Allergy status to other antibiotic agents status: Secondary | ICD-10-CM | POA: Diagnosis not present

## 2021-07-21 DIAGNOSIS — K920 Hematemesis: Secondary | ICD-10-CM | POA: Diagnosis present

## 2021-07-21 DIAGNOSIS — M1611 Unilateral primary osteoarthritis, right hip: Secondary | ICD-10-CM | POA: Diagnosis present

## 2021-07-21 DIAGNOSIS — Z79891 Long term (current) use of opiate analgesic: Secondary | ICD-10-CM | POA: Diagnosis not present

## 2021-07-21 DIAGNOSIS — K5900 Constipation, unspecified: Secondary | ICD-10-CM | POA: Diagnosis present

## 2021-07-21 DIAGNOSIS — M545 Low back pain, unspecified: Secondary | ICD-10-CM | POA: Diagnosis not present

## 2021-07-21 DIAGNOSIS — M5136 Other intervertebral disc degeneration, lumbar region: Secondary | ICD-10-CM | POA: Diagnosis present

## 2021-07-21 DIAGNOSIS — F17210 Nicotine dependence, cigarettes, uncomplicated: Secondary | ICD-10-CM | POA: Diagnosis present

## 2021-07-21 DIAGNOSIS — F32A Depression, unspecified: Secondary | ICD-10-CM | POA: Diagnosis present

## 2021-07-21 DIAGNOSIS — E538 Deficiency of other specified B group vitamins: Secondary | ICD-10-CM | POA: Diagnosis present

## 2021-07-21 DIAGNOSIS — D509 Iron deficiency anemia, unspecified: Secondary | ICD-10-CM | POA: Diagnosis present

## 2021-07-21 DIAGNOSIS — M5441 Lumbago with sciatica, right side: Secondary | ICD-10-CM | POA: Diagnosis not present

## 2021-07-21 DIAGNOSIS — K589 Irritable bowel syndrome without diarrhea: Secondary | ICD-10-CM | POA: Diagnosis present

## 2021-07-21 DIAGNOSIS — X58XXXA Exposure to other specified factors, initial encounter: Secondary | ICD-10-CM | POA: Diagnosis present

## 2021-07-21 DIAGNOSIS — E876 Hypokalemia: Secondary | ICD-10-CM | POA: Diagnosis not present

## 2021-07-21 DIAGNOSIS — M549 Dorsalgia, unspecified: Secondary | ICD-10-CM | POA: Diagnosis present

## 2021-07-21 DIAGNOSIS — N200 Calculus of kidney: Secondary | ICD-10-CM | POA: Diagnosis present

## 2021-07-21 DIAGNOSIS — G8929 Other chronic pain: Secondary | ICD-10-CM | POA: Diagnosis not present

## 2021-07-21 DIAGNOSIS — Z885 Allergy status to narcotic agent status: Secondary | ICD-10-CM | POA: Diagnosis not present

## 2021-07-21 DIAGNOSIS — Z716 Tobacco abuse counseling: Secondary | ICD-10-CM | POA: Diagnosis not present

## 2021-07-21 DIAGNOSIS — K5730 Diverticulosis of large intestine without perforation or abscess without bleeding: Secondary | ICD-10-CM | POA: Diagnosis present

## 2021-07-21 DIAGNOSIS — N1330 Unspecified hydronephrosis: Secondary | ICD-10-CM | POA: Diagnosis present

## 2021-07-21 DIAGNOSIS — M25551 Pain in right hip: Secondary | ICD-10-CM | POA: Diagnosis not present

## 2021-07-21 DIAGNOSIS — F419 Anxiety disorder, unspecified: Secondary | ICD-10-CM | POA: Diagnosis present

## 2021-07-21 LAB — IRON AND TIBC
Iron: 89 ug/dL (ref 28–170)
Saturation Ratios: 39 % — ABNORMAL HIGH (ref 10.4–31.8)
TIBC: 230 ug/dL — ABNORMAL LOW (ref 250–450)
UIBC: 141 ug/dL

## 2021-07-21 LAB — VITAMIN B12: Vitamin B-12: 162 pg/mL — ABNORMAL LOW (ref 180–914)

## 2021-07-21 LAB — BASIC METABOLIC PANEL
Anion gap: 3 — ABNORMAL LOW (ref 5–15)
BUN: 19 mg/dL (ref 8–23)
CO2: 28 mmol/L (ref 22–32)
Calcium: 8.4 mg/dL — ABNORMAL LOW (ref 8.9–10.3)
Chloride: 110 mmol/L (ref 98–111)
Creatinine, Ser: 0.64 mg/dL (ref 0.44–1.00)
GFR, Estimated: >60 mL/min (ref >60)
Glucose, Bld: 93 mg/dL (ref 70–99)
Potassium: 3.7 mmol/L (ref 3.5–5.1)
Sodium: 141 mmol/L (ref 135–145)

## 2021-07-21 LAB — CBC
HCT: 28 % — ABNORMAL LOW (ref 36.0–46.0)
Hemoglobin: 9.3 g/dL — ABNORMAL LOW (ref 12.0–15.0)
MCH: 30.6 pg (ref 26.0–34.0)
MCHC: 33.2 g/dL (ref 30.0–36.0)
MCV: 92.1 fL (ref 80.0–100.0)
Platelets: 128 10*3/uL — ABNORMAL LOW (ref 150–400)
RBC: 3.04 MIL/uL — ABNORMAL LOW (ref 3.87–5.11)
RDW: 12.9 % (ref 11.5–15.5)
WBC: 6.6 10*3/uL (ref 4.0–10.5)
nRBC: 0 % (ref 0.0–0.2)

## 2021-07-21 LAB — URINE CULTURE: Culture: NO GROWTH

## 2021-07-21 LAB — FERRITIN: Ferritin: 23 ng/mL (ref 11–307)

## 2021-07-21 MED ORDER — PANTOPRAZOLE SODIUM 40 MG PO TBEC
40.0000 mg | DELAYED_RELEASE_TABLET | Freq: Two times a day (BID) | ORAL | Status: DC
  Administered 2021-07-21 – 2021-07-24 (×6): 40 mg via ORAL
  Filled 2021-07-21 (×6): qty 1

## 2021-07-21 MED ORDER — BACLOFEN 10 MG PO TABS
10.0000 mg | ORAL_TABLET | Freq: Three times a day (TID) | ORAL | Status: DC
Start: 2021-07-21 — End: 2021-07-24
  Administered 2021-07-21 – 2021-07-24 (×8): 10 mg via ORAL
  Filled 2021-07-21 (×8): qty 1

## 2021-07-21 NOTE — Consult Note (Signed)
ORTHOPAEDIC CONSULTATION  REQUESTING PHYSICIAN: Jennye Boroughs, MD  Chief Complaint: Right lower back and hip pain  HPI: Sally Wilson is a 71 y.o. female who complains of right lower back and hip pain for the last 2 weeks.  She had back problems on and off for many years.  This exacerbated itself 2 weeks ago.  She was seen in the emergency room last week.  She has been taking high doses of Aleve.  She was also given a steroid Dosepak on 07/14/2021.  She was also given baclofen and oxycodone.  CT scan showed nonobstructive stone in the kidney.  Return to the emergency room 07/19/2021 she had CT scan of the abdomen and pelvis which showed no acute findings and a nonobstructive calculus.  There was some diverticulosis.  MRI of the right hip showed no fractures with some hip osteoarthritis noted.  Superior labral tearing.  There was some chronic tearing of the gluteus minimus tendon.  MRI of the lumbar spine showed severe degenerative changes at L2-3 with mild retrolisthesis.  There was some right subarticular stenosis and some contact with the L3 nerve root.  L3-4 showed some stenosis in contact with the L4 root.  L4-5 showed mild anterolisthesis with mild canal stenosis.  L5-S1 showed mild to moderate neural aminal for foraminal stenosis bilaterally.  Plain x-rays of the right hip and pelvis 07/20/2021 showed no significant arthritis in the right hip on plain views and no sacroiliac changes.  X-rays of the lumbar spine 07/20/2021 showed severe degenerative disc disease at L2-3 primarily with retrolisthesis.  There is degenerative disc disease elsewhere in the lumbar region as well.  There is foraminal nerve root compression on the right at several levels.  She describes pain is along the right lower back and over the right gluteus region.   Past Medical History:  Diagnosis Date   Arthritis    Current tobacco use    Dysplastic nevus 03/08/2021   left upper back paraspinal 2.5cm lat to spine- Excised  04/17/21   IBS (irritable bowel syndrome)    Kidney stone    Past Surgical History:  Procedure Laterality Date   BREAST BIOPSY Right 2005?   poss stereotatic bx neg   COLONOSCOPY WITH PROPOFOL N/A 11/02/2020   Procedure: COLONOSCOPY WITH PROPOFOL;  Surgeon: Lucilla Lame, MD;  Location[facility] ENDOSCOPY;  Service: Endoscopy;  Laterality: N/A;   TUBAL LIGATION     Social History   Socioeconomic History   Marital status: Widowed    Spouse name: Not on file   Number of children: Not on file   Years of education: Not on file   Highest education level: Not on file  Occupational History   Not on file  Tobacco Use   Smoking status: Some Days   Smokeless tobacco: Never   Tobacco comments:    5 cigarettes a day  Vaping Use   Vaping Use: Never used  Substance and Sexual Activity   Alcohol use: No   Drug use: No   Sexual activity: Not on file  Other Topics Concern   Not on file  Social History Narrative   Not on file   Social Determinants of Health   Financial Resource Strain: Not on file  Food Insecurity: Not on file  Transportation Needs: Not on file  Physical Activity: Not on file  Stress: Not on file  Social Connections: Not on file   Family History  Problem Relation Age of Onset   Breast cancer Maternal Aunt  Breast cancer Maternal Aunt    Allergies  Allergen Reactions   Cephalexin Swelling    Thrush of mouth and genitalia. Swelling Stomach cramping. Thrush of mouth and genitalia. Swelling Stomach cramping. Thrush of mouth and genitalia. Swelling Stomach cramping.   Codeine     Other reaction(s): Other (See Comments)   Penicillin G     Other reaction(s): Other (See Comments)   Prior to Admission medications   Medication Sig Start Date End Date Taking? Authorizing Provider  acetaminophen (TYLENOL) 325 MG tablet Take 650 mg by mouth every 6 (six) hours as needed for mild pain.   Yes [provider]  baclofen (LIORESAL) 10 MG tablet Take 1 tablet  (10 mg total) by mouth 3 (three) times daily for 7 days. 07/14/21 07/21/21 Yes Fisher, Linden Dolin, PA-C  clonazePAM (KLONOPIN) 1 MG tablet Take 1 mg by mouth daily.  02/24/15  Yes [provider]  Misc Natural Products (NARCOSOFT HERBAL LAX) CAPS Take 2 tablets by mouth at bedtime.   Yes [provider]  mupirocin ointment (BACTROBAN) 2 % Apply 1 application. topically daily. Qd to excision site 04/17/21  Yes Ralene Bathe, MD  PAXIL CR 25 MG 24 hr tablet Take 25 mg by mouth daily.  02/15/15  Yes [provider]  predniSONE (STERAPRED UNI-PAK 21 TAB) 10 MG (21) TBPK tablet Take 6 pills on day one then decrease by 1 pill each day 07/14/21  Yes Fisher, Linden Dolin, PA-C  tiZANidine (ZANAFLEX) 4 MG tablet Take 4 mg by mouth 3 (three) times daily as needed. 07/19/21 07/29/21 Yes [provider]  traZODone (DESYREL) 100 MG tablet Take 100 mg by mouth at bedtime.  02/24/15  Yes [provider]  HYDROmorphone (DILAUDID) 2 MG tablet Take 0.5 tablets (1 mg total) by mouth every 6 (six) hours as needed for up to 3 days for severe pain. 07/18/21 07/21/21  Triplett, Johnette Abraham B, FNP  mometasone (ELOCON) 0.1 % cream Apply 1 application topically daily as needed (Rash). Qd to aa right ear prn flares 03/08/21   Ralene Bathe, MD  ondansetron (ZOFRAN-ODT) 4 MG disintegrating tablet Take 1 tablet (4 mg total) by mouth every 8 (eight) hours as needed for nausea or vomiting. 07/18/21   Victorino Dike, FNP   DG HIP UNILAT WITH PELVIS 1V RIGHT  Result Date: 07/20/2021 CLINICAL DATA:  Rt hip and low back pain. No known injury.  [id number] EXAM: DG HIP (WITH OR WITHOUT PELVIS) 1V RIGHT COMPARISON:  None Available. FINDINGS: There is no evidence of hip fracture or dislocation of the right hip. No acute displaced fracture or dislocation of the left hip on frontal view. No acute displaced fracture or diastasis of the bones of the pelvis. There is no evidence of arthropathy or other focal bone abnormality.  IMPRESSION: Negative. Electronically Signed   By: Iven Finn M.D.   On: 07/20/2021 20:27   DG Lumbar Spine Complete  Result Date: 07/20/2021 CLINICAL DATA:  Right hip and low back pain.  No known injury. EXAM: LUMBAR SPINE - COMPLETE 4+ VIEW COMPARISON:  None Available. FINDINGS: Mild scoliosis. Diffuse degenerative disc disease and facet disease throughout the lumbar spine. No fracture or subluxation. SI joints symmetric and unremarkable. IMPRESSION: Scoliosis and degenerative disc/facet disease. No acute bony abnormality. Electronically Signed   By: Rolm Baptise M.D.   On: 07/20/2021 20:27   MR LUMBAR SPINE WO CONTRAST  Result Date: 07/19/2021 CLINICAL DATA:  Low back pain, infection suspected, positive xray/CT  EXAM: MRI LUMBAR SPINE WITHOUT CONTRAST TECHNIQUE: Multiplanar, multisequence MR imaging of the lumbar spine was performed. No intravenous contrast was administered. COMPARISON:  None Available. FINDINGS: Segmentation:  Standard. Alignment: There is mild retrolisthesis at L2-L3. Grade 1 anterolisthesis at L4-L5. Vertebrae: There are multilevel degenerative endplate changes most prominent at L1-L2 and L2-L3. There is no evidence of acute fracture, discitis, or aggressive osseous lesion. Conus medullaris and cauda equina: Conus extends to the L1-L2 level. Conus and cauda equina appear normal. Paraspinal and other soft tissues: Tiny left renal cyst which requires no follow-up. Lower paraspinal muscle atrophy. Disc levels: T12-L1: Minimal disc bulging. Ligamentum flavum hypertrophy and bilateral facet arthropathy. There is minimal spinal canal narrowing. Mild right neural foraminal stenosis. L1-L2: Minimal disc bulging. Ligamentum flavum hypertrophy and mild facet arthropathy. Minimal spinal canal narrowing. Mild left neural foraminal stenosis. L2-L3: Mild retrolisthesis. Ligament flavum hypertrophy and bilateral facet arthropathy with bilateral facet effusions. Minimal disc bulging and endplate  spurring. There is left greater than right subarticular stenosis and contact with the descending left L3 nerve root. There is moderate left neural foraminal stenosis. Mild right neural foraminal stenosis. L3-L4: Minimal asymmetric left disc bulging. Ligamentum flavum hypertrophy and bilateral facet arthropathy. There is mild right subarticular stenosis and contact with the descending right L4 nerve root. There is mild bilateral neural foraminal stenosis. L4-L5: Grade 1 anterolisthesis with minimal disc bulging. Ligamentum flavum hypertrophy and bilateral facet arthropathy with right greater left facet effusions. There is mild spinal canal stenosis with moderate bilateral subarticular stenosis and contact with the descending L5 nerve roots bilaterally. L5-S1: Mild disc bulging. Ligament flavum hypertrophy and bilateral facet arthropathy. No spinal canal stenosis. There is mild-to-moderate bilateral neural foraminal stenosis. IMPRESSION: Multilevel degenerative changes of the lumbar spine, summarized below: T12-L1: Minimal spinal canal narrowing. Mild right neural foraminal stenosis. L2: Minimal spinal canal narrowing. Mild left neural foraminal stenosis. L2-L3: Mild retrolisthesis. Left greater than right subarticular stenosis and contact with the descending left L3 nerve root. Moderate left and mild right neural foraminal stenosis. L3-L4: Right subarticular stenosis and contact with the descending right L4 nerve root. Mild bilateral neural foraminal stenosis. L4-L5: Grade 1 anterolisthesis. Mild spinal canal stenosis. Moderate bilateral subarticular stenosis and contact with the descending L5 nerve roots bilaterally. L5-S1: Mild-to-moderate bilateral neural foraminal stenosis. No spinal canal stenosis. Electronically Signed   By: Maurine Simmering M.D.   On: 07/19/2021 21:00   MR HIP RIGHT WO CONTRAST  Result Date: 07/19/2021 CLINICAL DATA:  Fracture, hip EXAM: MR OF THE RIGHT HIP WITHOUT CONTRAST TECHNIQUE:  Multiplanar, multisequence MR imaging was performed. No intravenous contrast was administered. COMPARISON:  None Available. FINDINGS: Bones: There is no evidence of acute fracture, dislocation or avascular necrosis. No focal bone lesion. Mild degenerative changes of the SI joints. The pubic symphysis is unremarkable. Lower lumbar spine degenerative disc disease, partially visualized. 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 Articular cartilage and labrum Articular cartilage:  Moderate chondrosis. Labrum: There is degenerative superior labral tearing anteriorly and posteriorly. Joint or bursal effusion Joint effusion: No significant hip joint effusion. Bursae: No evidence of trochanteric bursitis. Muscles and tendons Muscles and tendons: There is tendinosis and probable chronic insertional partial tearing of the distal gluteus minimus tendon at the greater trochanter. There is mild peritrochanteric edema. Similar findings along the left hip. There is tendinosis of the proximal hamstrings bilaterally.There is edema within the quadratus femoris muscles bilaterally with narrowed ischiofemoral space. There is some reactive muscle edema  in the hip adductors bilaterally and in the right gluteus maximus. There is a benign tiny enchondroma in the left proximal femur. Other findings Miscellaneous: Colonic diverticulosis. IMPRESSION: No evidence of acute hip fracture. Moderate right hip osteoarthritis with degenerative superior labral tearing anteriorly and posteriorly. 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 Mild peritrochanteric edema without significant bursitis. Mild findings of ischiofemoral impingement bilaterally. Electronically Signed   By: Maurine Simmering M.D.   On: 07/19/2021 20:50   CT ABDOMEN PELVIS W CONTRAST  Result Date: 07/19/2021 CLINICAL DATA:  Flank pain EXAM: CT ABDOMEN AND PELVIS WITH CONTRAST TECHNIQUE: Multidetector CT imaging of the abdomen and pelvis was  performed using the standard protocol following bolus administration of intravenous contrast. RADIATION DOSE REDUCTION: This exam was performed according to the departmental dose-optimization program which includes automated exposure control, adjustment of the mA and/or kV according to patient size and/or use of iterative reconstruction technique. CONTRAST:  133m OMNIPAQUE IOHEXOL 300 MG/ML  SOLN COMPARISON:  07/18/2021 FINDINGS: Lower chest: No acute abnormality. Hepatobiliary: No solid liver abnormality is seen. No gallstones, gallbladder wall thickening, or biliary dilatation. Pancreas: Unremarkable. No pancreatic ductal dilatation or surrounding inflammatory changes. Spleen: Normal in size without significant abnormality. Adrenals/Urinary Tract: Adrenal glands are unremarkable. Small nonobstructive calculus of the inferior pole of the right kidney. No left-sided calculi, ureteral calculi, or hydronephrosis. Bladder is unremarkable. Stomach/Bowel: Stomach is within normal limits. Appendix appears normal. No evidence of bowel wall thickening, distention, or inflammatory changes. Descending and sigmoid diverticulosis. Moderate burden of stool throughout the colon and rectum. Vascular/Lymphatic: Aortic atherosclerosis. No enlarged abdominal or pelvic lymph nodes. Reproductive: Small calcified uterine fibroids. Other: No abdominal wall hernia or abnormality. No ascites. Musculoskeletal: No acute or significant osseous findings. IMPRESSION: 1. No acute CT findings of the abdomen or pelvis to explain pain. 2. Small nonobstructive calculus of the inferior pole of the right kidney. No left-sided calculi, ureteral calculi, or hydronephrosis. 3. Descending and sigmoid diverticulosis without evidence of acute diverticulitis. 4. Moderate burden of stool throughout the colon and rectum. Aortic Atherosclerosis (ICD10-I70.0). Electronically Signed   By: ADelanna AhmadiM.D.   On: 07/19/2021 16:24    Positive ROS: All other  systems have been reviewed and were otherwise negative with the exception of those mentioned in the HPI and as above.  Physical Exam: General: Alert, no acute distress Cardiovascular: No pedal edema Respiratory: No cyanosis, no use of accessory musculature GI: No organomegaly, abdomen is soft and non-tender Skin: No lesions in the area of chief complaint Neurologic: Sensation intact distally Psychiatric: Patient is competent for consent with normal mood and affect Lymphatic: No axillary or cervical lymphadenopathy  MUSCULOSKELETAL: Patient alert and cooperative lying quietly in bed.  The right lower lumbar region is tender to percussion with some muscle tightness.  She is very tender over the right gluteus musculature.  Tendon insertion is not involved.  The trochanter is not painful.  She has excellent range of motion of the hip itself with no significant pain.  Neurovascular is good distally.  Motor and sensory is good distally.  I did not attempt to get her out of the bed today.  The left hip has full range of motion without pain or any tenderness.  Assessment: Severe degenerative disc disease L2-3 primarily with radiation into the buttock.   Plan: Conservative treatment. Heating pad to right buttock region Steroids if safe to give. Muscle relaxers. PT for mobilization. [facility]  [facility].    Park Breed, MD [phone]   07/21/2021 2:09 PM

## 2021-07-21 NOTE — Evaluation (Signed)
Occupational Therapy Evaluation Patient Details Name: Sally Wilson MRN: [id number] DOB: November 09, 1950 Today's Date: 07/21/2021   History of Present Illness Sally Wilson is a 51yoF who comes to [facility] on 07/18/21 with Rt flank pain. similar complaint on 07/14/21. CT scan shows a 6 mm nonobstructing stone with mild hydronephrosis. Other imaging reveals a number of chronic degenerative findings at the Rt hip joint, lumbar spine, and Right hip musculature.   Clinical Impression   Patient presenting with decreased Ind in self care, balance, functional mobility/transfers, endurance, and safety awareness. Patient reports being independent at baseline without use of AD and cares for her disabled adult son. She has a significant other that is at there at night. Pt currently reporting such an increase in pain that she is unable to tolerate HOB being elevated and needed education on safety concerns with eating food in that position. Pt is able to tolerate log roll to EOB with min A and cuing for technique. She tolerates sitting EOB for 1-2 minutes before returning to bed secondary to pain. Pt Patient will benefit from acute OT to increase overall independence in the areas of ADLs, functional mobility, and safety awareness in order to safely discharge to next venue of care.      Recommendations for follow up therapy are one component of a multi-disciplinary discharge planning process, led by the attending physician.  Recommendations may be updated based on patient status, additional functional criteria and insurance authorization.   Follow Up Recommendations  Skilled nursing-short term rehab (<3 hours/day)    Assistance Recommended at Discharge Intermittent Supervision/Assistance  Patient can return home with the following A lot of help with walking and/or transfers;A lot of help with bathing/dressing/bathroom;Assistance with cooking/housework;Direct supervision/assist for medications management;Help with stairs  or ramp for entrance;Direct supervision/assist for financial management;Assist for transportation    Functional Status Assessment  Patient has had a recent decline in their functional status and demonstrates the ability to make significant improvements in function in a reasonable and predictable amount of time.  Equipment Recommendations  None recommended by OT (defer to next venue of care)       Precautions / Restrictions Precautions Precautions: Fall      Mobility Bed Mobility Overal bed mobility: Needs Assistance Bed Mobility: Supine to Sit, Sit to Supine     Supine to sit: Min assist Sit to supine: Min assist   General bed mobility comments: cuing for log roll secondary to reports of increased back pain    Transfers                   General transfer comment: Pt unable to tolerate          ADL either performed or assessed with clinical judgement   ADL Overall ADL's : Needs assistance/impaired                                       General ADL Comments: Pt is unable to tolerate HOB being elevated for meals and unable to tolerate sitting EOB more than 1-2 minutes this session. Self care would have to be performed all bed level.     Vision Patient Visual Report: No change from baseline              Pertinent Vitals/Pain Pain Assessment Pain Assessment: 0-10 Pain Score: 8  Pain Location: lower back Pain Descriptors / Indicators: Aching, Discomfort, Grimacing, Guarding Pain  Intervention(s): Limited activity within patient's tolerance, Monitored during session, Premedicated before session, Repositioned     Hand Dominance Right   Extremity/Trunk Assessment Upper Extremity Assessment Upper Extremity Assessment: Overall WFL for tasks assessed   Lower Extremity Assessment Lower Extremity Assessment: Overall WFL for tasks assessed       Communication Communication Communication: No difficulties   Cognition Arousal/Alertness:  Awake/alert Behavior During Therapy: Anxious Overall Cognitive Status: Within Functional Limits for tasks assessed                                                  Home Living Family/patient expects to be discharged to:: Private residence Living Arrangements: Children;Spouse/significant other Available Help at Discharge: Family Type of Home: House Home Access: Stairs to enter [employer] of Steps: 1 step in back   Home Layout: One level     Bathroom Shower/Tub: Chief Strategy Officer: Wheelchair - manual;Tub bench          Prior Functioning/Environment Prior Level of Function : Driving             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 but independent with all mobility ADLs Comments: Pt reports assisting disabled son with IADLs as well. She is independent in all self care tasks.        OT Problem List: Pain;Decreased activity tolerance;Decreased safety awareness;Impaired balance (sitting and/or standing);Decreased knowledge of use of DME or AE;Decreased knowledge of precautions      OT Treatment/Interventions: Self-care/ADL training;Balance training;Therapeutic exercise;Therapeutic activities;Energy conservation;DME and/or AE instruction;Patient/family education;Manual therapy    OT Goals(Current goals can be found in the care plan section) Acute Rehab OT Goals Patient Stated Goal: to get stronger and go to rehab OT Goal Formulation: With patient Time For Goal Achievement: 08/04/21 Potential to Achieve Goals: Good ADL Goals Pt Will Perform Grooming: (P) with modified independence Pt Will Perform Lower Body Dressing: (P) with min assist Pt Will Transfer to Toilet: (P) with min assist Pt Will Perform Toileting - Clothing Manipulation and hygiene: (P) with min assist  OT Frequency: Min 2X/week       AM-PAC OT "6 Clicks" Daily Activity     Outcome Measure Help from another person eating meals?: None Help from another  person taking care of personal grooming?: A Little Help from another person toileting, which includes using toliet, bedpan, or urinal?: A Little Help from another person bathing (including washing, rinsing, drying)?: A Little Help from another person to put on and taking off regular upper body clothing?: A Little Help from another person to put on and taking off regular lower body clothing?: A Little 6 Click Score: 19   End of Session Nurse Communication: Mobility status  Activity Tolerance: Patient limited by pain Patient left: in bed;with call bell/phone within reach;with family/visitor present  OT Visit Diagnosis: Unsteadiness on feet (R26.81);Repeated falls (R29.6);Muscle weakness (generalized) (M62.81)                Time: 1287-8676 OT Time Calculation (min): 22 min Charges:  OT General Charges $OT Visit: 1 Visit OT Evaluation $OT Eval Moderate Complexity: 1 Mod OT Treatments $Therapeutic Activity: 8-22 mins  Darleen Crocker, MS, OTR/L , CBIS ascom [phone]  07/21/21, 12:49 PM

## 2021-07-21 NOTE — Progress Notes (Signed)
Progress Note    Sally Wilson  EGB:[id number] DOB: January 13, 1951  DOA: 07/19/2021 PCP: Idelle Crouch, MD      Brief Narrative:    Medical records reviewed and are as summarized below:  Sally Wilson is a 71 y.o. female with medical history significant for IBS, tobacco use disorder, arthritis, who presented to the hospital because of severe pain on the right hip.  She had previously been to the emergency room on 2 occasions within the past week for similar complaints.  She came back to the hospital because of worsening right hip pain.  She also reported vomiting coffee-ground material.  She said she vomited about 3 times.  The first 2 vomitus was not bloody but the third vomitus looked like coffee-ground material.  No abdominal pain or rectal bleeding.  Of note, she said she haD been taking naproxen 750 mg daily for about 9 days.       Assessment/Plan:   Principal Problem:   Back pain Active Problems:   Hip pain, chronic, right   Tobacco abuse   Heart palpitations   Coffee ground emesis   Hypokalemia   Right hip pain, back pain, moderate right hip arthritis, right gluteus minimus tendon tear, right peritrochanteric edema, degenerative disc disease lumbar spine,, multilevel foraminal canal stenosis from T12-S1: Continue analgesics as needed for pain.  PT recommended discharge to SNF.  Awaiting evaluation by orthopedic surgeon.  Consult PT and OT.    Right nonobstructive nephrolithiasis: Outpatient follow-up with PCP  Hematemesis: Resolved.  Probably from recent vomiting.  This could also be from recent naproxen use.  Change IV to oral Protonix.  Use SCDs for DVT prophylaxis.  Recent colonoscopy in September 2022 showed colonic polyps.  Outpatient follow-up with gastroenterologist  Anemia, probably from acute blood loss anemia: Monitor H&H.  Check iron studies and vitamin B12 level  Hypokalemia: Improved.  Tobacco use disorder continue nicotine patch  Other  comorbidities include anxiety, depression   Diet Order             Diet Heart Room service appropriate? Yes; Fluid consistency: Thin  Diet effective now                            Consultants: Orthopedic surgeon  Procedures: None    Medications:    acetaminophen  1,000 mg Oral Once   clonazePAM  1 mg Oral Daily   lidocaine  1 patch Transdermal Q24H   nicotine  14 mg Transdermal Daily   pantoprazole  40 mg Oral BID   PARoxetine  25 mg Oral Daily   sodium chloride flush  3 mL Intravenous Q12H   Continuous Infusions:     Anti-infectives (From admission, onward)    None              Family Communication/Anticipated D/C date and plan/Code Status   DVT prophylaxis:      Code Status: Full Code  Family Communication: Plan discussed with her boyfriend at the bedside Disposition Plan: She may have to go to SNF for rehab   Status is: Observation The patient will require care spanning > 2 midnights and should be moved to inpatient because: Severe right hip pain, inability to stand       Subjective:   She complains of right lower abdominal pain and right hip pain.  Her boyfriend was at the bedside.  No vomiting/hematemesis.  Objective:    Vitals:  07/20/21 2027 07/21/21 0500 07/21/21 0511 07/21/21 0737  BP: 112/68  114/70 113/65  Pulse: 72  69 73  Resp: '18  18 17  '$ Temp: 98.4 F (36.9 C)  98.1 F (36.7 C) 98.2 F (36.8 C)  TempSrc:      SpO2: 99%  96% 100%  Weight:  64.3 kg     No data found.   Intake/Output Summary (Last 24 hours) at 07/21/2021 1223 Last data filed at 07/20/2021 1634 Gross per 24 hour  Intake 798.91 ml  Output --  Net 798.91 ml   Filed Weights   07/21/21 0500  Weight: 64.3 kg    Exam:  GEN: NAD SKIN: No rash EYES: EOMI ENT: MMM CV: RRR PULM: CTA B ABD: soft, ND, NT, +BS CNS: AAO x 3, non focal EXT: No edema or tenderness GU: No CVA tenderness MSK: Straight leg raising sign is negative.   Right hip tenderness without erythema or swelling       Data Reviewed:   I have personally reviewed following labs and imaging studies:  Labs: Labs show the following:   Basic Metabolic Panel: Recent Labs  Lab 07/18/21 1348 07/19/21 1310 07/20/21 0607 07/21/21 0533  NA 139 137 141 141  K 3.1* 3.3* 3.2* 3.7  CL 104 104 111 110  CO2 '28 27 29 28  '$ GLUCOSE 126* 126* 100* 93  BUN 18 47* 32* 19  CREATININE 0.90 0.76 0.67 0.64  CALCIUM 8.9 8.5* 8.2* 8.4*  MG  --   --  2.1  --    GFR Estimated Creatinine Clearance: 60.4 mL/min (by C-G formula based on SCr of 0.64 mg/dL). Liver Function Tests: Recent Labs  Lab 07/18/21 1348 07/19/21 1310 07/20/21 0607  AST 17 11* 10*  ALT '19 17 11  '$ ALKPHOS 59 43 37*  BILITOT 0.9 0.5 0.5  PROT 6.5 5.7* 5.2*  ALBUMIN 3.7 3.3* 2.9*   Recent Labs  Lab 07/18/21 1348 07/19/21 1310  LIPASE 28 25   No results for input(s): "AMMONIA" in the last 168 hours. Coagulation profile No results for input(s): "INR", "PROTIME" in the last 168 hours.  CBC: Recent Labs  Lab 07/18/21 1348 07/19/21 1310 07/20/21 0607 07/21/21 0533  WBC 8.4 10.7* 7.6 6.6  NEUTROABS 6.9  --   --   --   HGB 15.5* 12.8 10.3* 9.3*  HCT 44.8 37.8 30.9* 28.0*  MCV 89.4 88.9 91.4 92.1  PLT 159 167 150 128*   Cardiac Enzymes: No results for input(s): "CKTOTAL", "CKMB", "CKMBINDEX", "TROPONINI" in the last 168 hours. BNP (last 3 results) No results for input(s): "PROBNP" in the last 8760 hours. CBG: No results for input(s): "GLUCAP" in the last 168 hours. D-Dimer: Recent Labs    07/20/21 0607  DDIMER 0.81*   Hgb A1c: No results for input(s): "HGBA1C" in the last 72 hours. Lipid Profile: No results for input(s): "CHOL", "HDL", "LDLCALC", "TRIG", "CHOLHDL", "LDLDIRECT" in the last 72 hours. Thyroid function studies: No results for input(s): "TSH", "T4TOTAL", "T3FREE", "THYROIDAB" in the last 72 hours.  Invalid input(s): "FREET3" Anemia work up: No results  for input(s): "VITAMINB12", "FOLATE", "FERRITIN", "TIBC", "IRON", "RETICCTPCT" in the last 72 hours. Sepsis Labs: Recent Labs  Lab 07/18/21 1348 07/19/21 1310 07/20/21 0607 07/21/21 0533  WBC 8.4 10.7* 7.6 6.6    Microbiology Recent Results (from the past 240 hour(s))  Chlamydia/NGC rt PCR ([place] only)     Status: None   Collection Time: 07/19/21  6:33 PM   Specimen: Cervical/Vaginal swab  Result Value Ref Range Status   Specimen source GC/Chlam ENDOC  Final   Chlamydia Tr NOT DETECTED NOT DETECTED Final   N gonorrhoeae NOT DETECTED NOT DETECTED Final    Comment: (NOTE) This CT/NG assay has not been evaluated in patients with a history of  hysterectomy. Performed at [facility], [address] [place], [place] [address]   Wet prep, genital     Status: Abnormal   Collection Time: 07/19/21  6:33 PM   Specimen: Cervical/Vaginal swab  Result Value Ref Range Status   Yeast Wet Prep HPF POC NONE SEEN NONE SEEN Final   Trich, Wet Prep NONE SEEN NONE SEEN Final   Clue Cells Wet Prep HPF POC NONE SEEN NONE SEEN Final   WBC, Wet Prep HPF POC >=10 (A) <10 Final   Sperm NONE SEEN  Final    Comment: Performed at [facility], [address], [place], [place] [address]  Urine Culture     Status: None   Collection Time: 07/20/21  7:02 AM   Specimen: Urine, Clean Catch  Result Value Ref Range Status   Specimen Description   Final    URINE, CLEAN CATCH Performed at [facility], [address] [place], [place] [address]    Special Requests   Final    NONE Performed at [facility], [address] [place], [place] [address]    Culture   Final    NO GROWTH Performed at [facility], [place] [address], [place],  [address]    Report Status 07/21/2021 FINAL  Final    Procedures and diagnostic studies:  DG HIP UNILAT WITH PELVIS 1V RIGHT  Result Date: 07/20/2021 CLINICAL DATA:  Rt hip and low back pain. No known injury.  [id number]  EXAM: DG HIP (WITH OR WITHOUT PELVIS) 1V RIGHT COMPARISON:  None Available. FINDINGS: There is no evidence of hip fracture or dislocation of the right hip. No acute displaced fracture or dislocation of the left hip on frontal view. No acute displaced fracture or diastasis of the bones of the pelvis. There is no evidence of arthropathy or other focal bone abnormality. IMPRESSION: Negative. Electronically Signed   By: Iven Finn M.D.   On: 07/20/2021 20:27   DG Lumbar Spine Complete  Result Date: 07/20/2021 CLINICAL DATA:  Right hip and low back pain.  No known injury. EXAM: LUMBAR SPINE - COMPLETE 4+ VIEW COMPARISON:  None Available. FINDINGS: Mild scoliosis. Diffuse degenerative disc disease and facet disease throughout the lumbar spine. No fracture or subluxation. SI joints symmetric and unremarkable. IMPRESSION: Scoliosis and degenerative disc/facet disease. No acute bony abnormality. Electronically Signed   By: Rolm Baptise M.D.   On: 07/20/2021 20:27   MR LUMBAR SPINE WO CONTRAST  Result Date: 07/19/2021 CLINICAL DATA:  Low back pain, infection suspected, positive xray/CT EXAM: MRI LUMBAR SPINE WITHOUT CONTRAST TECHNIQUE: Multiplanar, multisequence MR imaging of the lumbar spine was performed. No intravenous contrast was administered. COMPARISON:  None Available. FINDINGS: Segmentation:  Standard. Alignment: There is mild retrolisthesis at L2-L3. Grade 1 anterolisthesis at L4-L5. Vertebrae: There are multilevel degenerative endplate changes most prominent at L1-L2 and L2-L3. There is no evidence of acute fracture, discitis, or aggressive osseous lesion. Conus medullaris and cauda equina: Conus extends to the L1-L2 level. Conus and cauda equina appear normal. Paraspinal and other soft tissues: Tiny left renal cyst which requires no follow-up. Lower paraspinal muscle atrophy. Disc levels: T12-L1: Minimal disc bulging. Ligamentum flavum hypertrophy and  bilateral facet arthropathy. There is minimal spinal  canal narrowing. Mild right neural foraminal stenosis. L1-L2: Minimal disc bulging. Ligamentum flavum hypertrophy and mild facet arthropathy. Minimal spinal canal narrowing. Mild left neural foraminal stenosis. L2-L3: Mild retrolisthesis. Ligament flavum hypertrophy and bilateral facet arthropathy with bilateral facet effusions. Minimal disc bulging and endplate spurring. There is left greater than right subarticular stenosis and contact with the descending left L3 nerve root. There is moderate left neural foraminal stenosis. Mild right neural foraminal stenosis. L3-L4: Minimal asymmetric left disc bulging. Ligamentum flavum hypertrophy and bilateral facet arthropathy. There is mild right subarticular stenosis and contact with the descending right L4 nerve root. There is mild bilateral neural foraminal stenosis. L4-L5: Grade 1 anterolisthesis with minimal disc bulging. Ligamentum flavum hypertrophy and bilateral facet arthropathy with right greater left facet effusions. There is mild spinal canal stenosis with moderate bilateral subarticular stenosis and contact with the descending L5 nerve roots bilaterally. L5-S1: Mild disc bulging. Ligament flavum hypertrophy and bilateral facet arthropathy. No spinal canal stenosis. There is mild-to-moderate bilateral neural foraminal stenosis. IMPRESSION: Multilevel degenerative changes of the lumbar spine, summarized below: T12-L1: Minimal spinal canal narrowing. Mild right neural foraminal stenosis. L2: Minimal spinal canal narrowing. Mild left neural foraminal stenosis. L2-L3: Mild retrolisthesis. Left greater than right subarticular stenosis and contact with the descending left L3 nerve root. Moderate left and mild right neural foraminal stenosis. L3-L4: Right subarticular stenosis and contact with the descending right L4 nerve root. Mild bilateral neural foraminal stenosis. L4-L5: Grade 1 anterolisthesis. Mild spinal canal stenosis. Moderate bilateral subarticular stenosis  and contact with the descending L5 nerve roots bilaterally. L5-S1: Mild-to-moderate bilateral neural foraminal stenosis. No spinal canal stenosis. Electronically Signed   By: Maurine Simmering M.D.   On: 07/19/2021 21:00   MR HIP RIGHT WO CONTRAST  Result Date: 07/19/2021 CLINICAL DATA:  Fracture, hip EXAM: MR OF THE RIGHT HIP WITHOUT CONTRAST TECHNIQUE: Multiplanar, multisequence MR imaging was performed. No intravenous contrast was administered. COMPARISON:  None Available. FINDINGS: Bones: There is no evidence of acute fracture, dislocation or avascular necrosis. No focal bone lesion. Mild degenerative changes of the SI joints. The pubic symphysis is unremarkable. Lower lumbar spine degenerative disc disease, partially visualized. 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 Articular cartilage and labrum Articular cartilage:  Moderate chondrosis. Labrum: There is degenerative superior labral tearing anteriorly and posteriorly. Joint or bursal effusion Joint effusion: No significant hip joint effusion. Bursae: No evidence of trochanteric bursitis. Muscles and tendons Muscles and tendons: There is tendinosis and probable chronic insertional partial tearing of the distal gluteus minimus tendon at the greater trochanter. There is mild peritrochanteric edema. Similar findings along the left hip. There is tendinosis of the proximal hamstrings bilaterally.There is edema within the quadratus femoris muscles bilaterally with narrowed ischiofemoral space. There is some reactive muscle edema in the hip adductors bilaterally and in the right gluteus maximus. There is a benign tiny enchondroma in the left proximal femur. Other findings Miscellaneous: Colonic diverticulosis. IMPRESSION: No evidence of acute hip fracture. Moderate right hip osteoarthritis with degenerative superior labral tearing anteriorly and posteriorly. 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 Mild peritrochanteric  edema without significant bursitis. Mild findings of ischiofemoral impingement bilaterally. Electronically Signed   By: Maurine Simmering M.D.   On: 07/19/2021 20:50   CT ABDOMEN PELVIS W CONTRAST  Result Date: 07/19/2021 CLINICAL DATA:  Flank pain EXAM: CT ABDOMEN AND PELVIS WITH CONTRAST TECHNIQUE: Multidetector CT imaging of the  abdomen and pelvis was performed using the standard protocol following bolus administration of intravenous contrast. RADIATION DOSE REDUCTION: This exam was performed according to the departmental dose-optimization program which includes automated exposure control, adjustment of the mA and/or kV according to patient size and/or use of iterative reconstruction technique. CONTRAST:  175m OMNIPAQUE IOHEXOL 300 MG/ML  SOLN COMPARISON:  07/18/2021 FINDINGS: Lower chest: No acute abnormality. Hepatobiliary: No solid liver abnormality is seen. No gallstones, gallbladder wall thickening, or biliary dilatation. Pancreas: Unremarkable. No pancreatic ductal dilatation or surrounding inflammatory changes. Spleen: Normal in size without significant abnormality. Adrenals/Urinary Tract: Adrenal glands are unremarkable. Small nonobstructive calculus of the inferior pole of the right kidney. No left-sided calculi, ureteral calculi, or hydronephrosis. Bladder is unremarkable. Stomach/Bowel: Stomach is within normal limits. Appendix appears normal. No evidence of bowel wall thickening, distention, or inflammatory changes. Descending and sigmoid diverticulosis. Moderate burden of stool throughout the colon and rectum. Vascular/Lymphatic: Aortic atherosclerosis. No enlarged abdominal or pelvic lymph nodes. Reproductive: Small calcified uterine fibroids. Other: No abdominal wall hernia or abnormality. No ascites. Musculoskeletal: No acute or significant osseous findings. IMPRESSION: 1. No acute CT findings of the abdomen or pelvis to explain pain. 2. Small nonobstructive calculus of the inferior pole of the  right kidney. No left-sided calculi, ureteral calculi, or hydronephrosis. 3. Descending and sigmoid diverticulosis without evidence of acute diverticulitis. 4. Moderate burden of stool throughout the colon and rectum. Aortic Atherosclerosis (ICD10-I70.0). Electronically Signed   By: ADelanna AhmadiM.D.   On: 07/19/2021 16:24               LOS: 0 days   Anthon Harpole  [facility]   Pager on [email or web address] If 7PM-7AM, please contact night-coverage at [email or web address]     07/21/2021, 12:23 PM

## 2021-07-22 DIAGNOSIS — M25551 Pain in right hip: Secondary | ICD-10-CM | POA: Diagnosis not present

## 2021-07-22 DIAGNOSIS — G8929 Other chronic pain: Secondary | ICD-10-CM | POA: Diagnosis not present

## 2021-07-22 DIAGNOSIS — E538 Deficiency of other specified B group vitamins: Secondary | ICD-10-CM

## 2021-07-22 DIAGNOSIS — E876 Hypokalemia: Secondary | ICD-10-CM | POA: Diagnosis not present

## 2021-07-22 LAB — CBC
HCT: 28.2 % — ABNORMAL LOW (ref 36.0–46.0)
Hemoglobin: 9.5 g/dL — ABNORMAL LOW (ref 12.0–15.0)
MCH: 30.7 pg (ref 26.0–34.0)
MCHC: 33.7 g/dL (ref 30.0–36.0)
MCV: 91.3 fL (ref 80.0–100.0)
Platelets: 127 10*3/uL — ABNORMAL LOW (ref 150–400)
RBC: 3.09 MIL/uL — ABNORMAL LOW (ref 3.87–5.11)
RDW: 12.9 % (ref 11.5–15.5)
WBC: 6.5 10*3/uL (ref 4.0–10.5)
nRBC: 0 % (ref 0.0–0.2)

## 2021-07-22 MED ORDER — CYANOCOBALAMIN 1000 MCG/ML IJ SOLN
1000.0000 ug | Freq: Every day | INTRAMUSCULAR | Status: DC
Start: 2021-07-22 — End: 2021-07-24
  Administered 2021-07-22 – 2021-07-24 (×3): 1000 ug via INTRAMUSCULAR
  Filled 2021-07-22 (×3): qty 1

## 2021-07-22 MED ORDER — CYANOCOBALAMIN 1000 MCG/ML IJ SOLN
1000.0000 ug | Freq: Every day | INTRAMUSCULAR | Status: DC
  Filled 2021-07-22: qty 1

## 2021-07-22 MED ORDER — ENOXAPARIN SODIUM 40 MG/0.4ML IJ SOSY
40.0000 mg | PREFILLED_SYRINGE | Freq: Every day | INTRAMUSCULAR | Status: DC
  Administered 2021-07-22 – 2021-07-23 (×2): 40 mg via SUBCUTANEOUS
  Filled 2021-07-22 (×2): qty 0.4

## 2021-07-22 MED ORDER — FERROUS SULFATE 325 (65 FE) MG PO TABS
325.0000 mg | ORAL_TABLET | ORAL | Status: DC
  Administered 2021-07-22 – 2021-07-24 (×2): 325 mg via ORAL
  Filled 2021-07-22 (×2): qty 1

## 2021-07-22 NOTE — TOC Initial Note (Signed)
Transition of Care [facility]) - Initial/Assessment Note    Patient Details  Name: Sally Wilson MRN: [id number] Date of Birth: Nov 02, 1950  Transition of Care [facility]) CM/SW Contact:    Magnus Ivan, LCSW Phone Number: 07/22/2021, 1:08 PM  Clinical Narrative:                CSW spoke with patient regarding SNF rec. Patient lives with her son and usually drives herself. PCP is Dr. Doy Hutching. Pharmacy is [facility]. Patient has a RW at home. Patient says she may have had [facility] in the past, wasn't sure. No SNF history. Patient stated she is agreeable to SNF. She would prefer [facility]. CSW is starting SNF work up.    Expected Discharge Plan: Skilled Nursing Facility Barriers to Discharge: Continued Medical Work up   Patient Goals and CMS Choice Patient states their goals for this hospitalization and ongoing recovery are:: SNF CMS Medicare.gov Compare Post Acute Care list provided to:: Patient Choice offered to / list presented to : Patient  Expected Discharge Plan and Services Expected Discharge Plan: [place]       Living arrangements for the past 2 months: Single Family Home                                      Prior Living Arrangements/Services Living arrangements for the past 2 months: Single Family Home Lives with:: Adult Children Patient language and need for interpreter reviewed:: Yes Do you feel safe going back to the place where you live?: Yes      Need for Family Participation in Patient Care: Yes (Comment) Care giver support system in place?: Yes (comment) Current home services: DME Criminal Activity/Legal Involvement Pertinent to Current Situation/Hospitalization: No - Comment as needed  Activities of Daily Living Home Assistive Devices/Equipment: None ADL Screening (condition at time of admission) Patient's cognitive ability adequate to safely complete daily activities?: Yes Is the patient deaf or have difficulty hearing?: No Does the  patient have difficulty seeing, even when wearing glasses/contacts?: No Does the patient have difficulty concentrating, remembering, or making decisions?: No Patient able to express need for assistance with ADLs?: Yes Does the patient have difficulty dressing or bathing?: No Independently performs ADLs?: Yes (appropriate for developmental age) Does the patient have difficulty walking or climbing stairs?: No Weakness of Legs: None Weakness of Arms/Hands: None  Permission Sought/Granted Permission sought to share information with : Facility Art therapist granted to share information with : Yes, Verbal Permission Granted     Permission granted to share info w AGENCY: SNFs        Emotional Assessment       Orientation: : Oriented to Self, Oriented to Place, Oriented to  Time, Oriented to Situation Alcohol / Substance Use: Not Applicable Psych Involvement: No (comment)  Admission diagnosis:  Back pain [M54.9] Right-sided low back pain without sciatica, unspecified chronicity [M54.50] Patient Active Problem List   Diagnosis Date Noted   Vitamin B12 deficiency 07/22/2021   Coffee ground emesis 07/20/2021   Hip pain, chronic, right 07/20/2021   Hypokalemia 07/20/2021   Back pain 07/19/2021   History of colonic polyps    Polyp of ascending colon    Age-related osteoporosis without current pathological fracture 03/05/2019   Heart palpitations 03/05/2019   Chronic low back pain 03/10/2018   Depression 06/20/2017   Tobacco abuse 12/10/2016   Family history of coronary  arteriosclerosis 12/10/2016   Abnormal EKG 12/10/2016   PCP:  Idelle Crouch, MD Pharmacy:   [facility] [address] [place]) [place] [address] Phone: [phone] Fax: [place], [place]. [place] [address] Phone: [phone] Fax:  [phone]     Social Determinants of Health (SDOH) Interventions    Readmission Risk Interventions    07/22/2021    1:07 PM  Readmission Risk Prevention Plan  Post Dischage Appt Complete  Medication Screening Complete  Transportation Screening Complete

## 2021-07-22 NOTE — NC FL2 (Signed)
[place] LEVEL OF CARE SCREENING TOOL     IDENTIFICATION  Patient Name: Sally Wilson Birthdate: 1950-02-13 Sex: female Admission Date (Current Location): 07/19/2021  [facility] and [place] Number:  Engineering geologist and Address:  [facility], [address], [place], [place] [address]      Provider Number: [id number]  Attending Physician Name and Address:  Jennye Boroughs, MD  Relative Name and Phone Number:  Karl Ito (Sister)   [phone] [facility] Phone)    Current Level of Care: Hospital Recommended Level of Care: [place] Prior Approval Number:    Date Approved/Denied:   PASRR Number: pending  Discharge Plan:      Current Diagnoses: Patient Active Problem List   Diagnosis Date Noted   Vitamin B12 deficiency 07/22/2021   Coffee ground emesis 07/20/2021   Hip pain, chronic, right 07/20/2021   Hypokalemia 07/20/2021   Back pain 07/19/2021   History of colonic polyps    Polyp of ascending colon    Age-related osteoporosis without current pathological fracture 03/05/2019   Heart palpitations 03/05/2019   Chronic low back pain 03/10/2018   Depression 06/20/2017   Tobacco abuse 12/10/2016   Family history of coronary arteriosclerosis 12/10/2016   Abnormal EKG 12/10/2016    Orientation RESPIRATION BLADDER Height & Weight     Self, Time, Situation, Place  Normal Continent Weight: 141 lb 12.1 oz (64.3 kg) Height:     BEHAVIORAL SYMPTOMS/MOOD NEUROLOGICAL BOWEL NUTRITION STATUS      Continent Diet (heart)  AMBULATORY STATUS COMMUNICATION OF NEEDS Skin   Limited Assist Verbally Bruising                       Personal Care Assistance Level of Assistance  Bathing, Feeding, Dressing Bathing Assistance: Limited assistance Feeding assistance: Limited assistance Dressing Assistance: Limited assistance     Functional Limitations Info             SPECIAL CARE FACTORS FREQUENCY  PT (By licensed  PT), OT (By licensed OT)     PT Frequency: 5 times per week OT Frequency: 5 times per week            Contractures      Additional Factors Info  Code Status, Allergies Code Status Info: full Allergies Info: Cephalexin, Codeine, Penicillin G           Current Medications (07/22/2021):  This is the current hospital active medication list Current Facility-Administered Medications  Medication Dose Route Frequency Provider Last Rate Last Admin   acetaminophen (TYLENOL) tablet 650 mg  650 mg Oral Q6H PRN Para Skeans, MD       Or   acetaminophen (TYLENOL) suppository 650 mg  650 mg Rectal Q6H PRN Para Skeans, MD       acetaminophen (TYLENOL) tablet 1,000 mg  1,000 mg Oral Once Vanessa Brayton, MD       baclofen (LIORESAL) tablet 10 mg  10 mg Oral TID Jennye Boroughs, MD   10 mg at 07/22/21 0944   clonazePAM (KLONOPIN) tablet 1 mg  1 mg Oral Daily Jennye Boroughs, MD   1 mg at 07/21/21 2213   cyanocobalamin ((VITAMIN B-12)) injection 1,000 mcg  1,000 mcg Intramuscular Daily Lorna Dibble, RPH   1,000 mcg at 07/22/21 1203   enoxaparin (LOVENOX) injection 40 mg  40 mg Subcutaneous QHS Jennye Boroughs, MD       ferrous sulfate tablet 325 mg  325 mg  Oral Tonye Pearson, MD   325 mg at 07/22/21 1202   HYDROmorphone (DILAUDID) tablet 1 mg  1 mg Oral Q6H PRN Para Skeans, MD   1 mg at 07/22/21 0542   lidocaine (LIDODERM) 5 % 1 patch  1 patch Transdermal Q24H Vanessa [place], MD   1 patch at 07/21/21 1515   morphine (PF) 2 MG/ML injection 2 mg  2 mg Intravenous Q4H PRN Jennye Boroughs, MD   2 mg at 07/22/21 0949   nicotine (NICODERM CQ - dosed in mg/24 hours) patch 14 mg  14 mg Transdermal Daily Para Skeans, MD   14 mg at 07/22/21 0948   ondansetron (ZOFRAN) injection 4 mg  4 mg Intravenous Q6H PRN Jennye Boroughs, MD   4 mg at 07/20/21 0821   pantoprazole (PROTONIX) EC tablet 40 mg  40 mg Oral BID Jennye Boroughs, MD   40 mg at 07/22/21 0944   PARoxetine (PAXIL-CR) 24 hr tablet 25 mg  25  mg Oral Daily Florina Ou V, MD   25 mg at 07/22/21 0944   sodium chloride flush (NS) 0.9 % injection 3 mL  3 mL Intravenous Q12H Para Skeans, MD   3 mL at 07/22/21 3500     Discharge Medications: Please see discharge summary for a list of discharge medications.  Relevant Imaging Results:  Relevant Lab Results:   Additional [place], LCSW

## 2021-07-22 NOTE — Progress Notes (Addendum)
Progress Note    Sally Wilson  YIR:[id number] DOB: 09/05/1950  DOA: 07/19/2021 PCP: Idelle Crouch, MD      Brief Narrative:    Medical records reviewed and are as summarized below:  Sally Wilson is a 71 y.o. female with medical history significant for IBS, tobacco use disorder, arthritis, vitamin B12 deficiency, who presented to the hospital because of severe pain on the right hip.  She had previously been to the emergency room on 2 occasions within the past week for similar complaints.  She came back to the hospital because of worsening right hip pain.  She also reported vomiting coffee-ground material.  She said she vomited about 3 times.  The first 2 vomitus was not bloody but the third vomitus looked like coffee-ground material.  No abdominal pain or rectal bleeding.  Of note, she said she haD been taking naproxen 750 mg daily for about 9 days.       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  Appreciate input from orthopedic surgeon.  Continue PT and OT.  Awaiting placement to SNF.  Right nonobstructive nephrolithiasis: Outpatient follow-up with PCP  Hematemesis: Resolved.  Probably from recent vomiting.  This could also be from recent naproxen use.  Continue Protonix.  Recent colonoscopy in September 2022 showed colonic polyps.  Outpatient follow-up with gastroenterologist  Anemia, probably from acute blood loss anemia, iron deficiency, vitamin B12 deficiency: Vitamin B12 level was 162.  Ferritin 23, iron saturation 13, iron 89  Hypokalemia:  Improved.  Tobacco use disorder continue nicotine patch  Other comorbidities include anxiety, depression   Diet Order             Diet Heart Room service appropriate? Yes; Fluid consistency: Thin  Diet effective now                            Consultants: Orthopedic surgeon  Procedures: None    Medications:    acetaminophen  1,000 mg Oral Once   baclofen  10 mg Oral TID   clonazePAM  1 mg Oral Daily   cyanocobalamin  1,000 mcg Subcutaneous Daily   enoxaparin (LOVENOX) injection  40 mg Subcutaneous QHS   ferrous sulfate  325 mg Oral QODAY   lidocaine  1 patch Transdermal Q24H   nicotine  14 mg Transdermal Daily   pantoprazole  40 mg Oral BID   PARoxetine  25 mg Oral Daily   sodium chloride flush  3 mL Intravenous Q12H   Continuous Infusions:     Anti-infectives (From admission, onward)    None              Family Communication/Anticipated D/C date and plan/Code Status   DVT prophylaxis: enoxaparin (LOVENOX) injection 40 mg Start: 07/22/21 2200     Code Status: Full Code  Family Communication: Plan discussed with her boyfriend at the bedside Disposition Plan: She may have to go to SNF for rehab   Status is: Observation The patient will require care spanning > 2 midnights and should be moved  to inpatient because: Severe right hip pain, inability to stand       Subjective:   Interval events noted.  She complains of right hip/buttock pain.  Objective:    Vitals:   07/21/21 1602 07/21/21 1956 07/22/21 0347 07/22/21 0740  BP: 116/65 133/66 115/68 128/67  Pulse: 62 69 66 (!) 56  Resp:  '16 17 16  '$ Temp: 98.7 F (37.1 C) 98.4 F (36.9 C) 98.1 F (36.7 C) 98.6 F (37 C)  TempSrc:  Oral Oral   SpO2:  100% 99% 100%  Weight:       No data found.   Intake/Output Summary (Last 24 hours) at 07/22/2021 1052 Last data filed at 07/22/2021 0956 Gross per 24 hour  Intake 3 ml  Output --  Net 3 ml   Filed Weights   07/21/21  0500  Weight: 64.3 kg    Exam:  GEN: NAD SKIN: No rash EYES: EOMI ENT: MMM CV: RRR PULM: CTA B ABD: soft, ND, NT, +BS CNS: AAO x 3, non focal EXT: No edema or tenderness MSK: Right hip tenderness without erythema or swelling.        Data Reviewed:   I have personally reviewed following labs and imaging studies:  Labs: Labs show the following:   Basic Metabolic Panel: Recent Labs  Lab 07/18/21 1348 07/19/21 1310 07/20/21 0607 07/21/21 0533  NA 139 137 141 141  K 3.1* 3.3* 3.2* 3.7  CL 104 104 111 110  CO2 '28 27 29 28  '$ GLUCOSE 126* 126* 100* 93  BUN 18 47* 32* 19  CREATININE 0.90 0.76 0.67 0.64  CALCIUM 8.9 8.5* 8.2* 8.4*  MG  --   --  2.1  --    GFR Estimated Creatinine Clearance: 60.4 mL/min (by C-G formula based on SCr of 0.64 mg/dL). Liver Function Tests: Recent Labs  Lab 07/18/21 1348 07/19/21 1310 07/20/21 0607  AST 17 11* 10*  ALT '19 17 11  '$ ALKPHOS 59 43 37*  BILITOT 0.9 0.5 0.5  PROT 6.5 5.7* 5.2*  ALBUMIN 3.7 3.3* 2.9*   Recent Labs  Lab 07/18/21 1348 07/19/21 1310  LIPASE 28 25   No results for input(s): "AMMONIA" in the last 168 hours. Coagulation profile No results for input(s): "INR", "PROTIME" in the last 168 hours.  CBC: Recent Labs  Lab 07/18/21 1348 07/19/21 1310 07/20/21 0607 07/21/21 0533 07/22/21 0510  WBC 8.4 10.7* 7.6 6.6 6.5  NEUTROABS 6.9  --   --   --   --   HGB 15.5* 12.8 10.3* 9.3* 9.5*  HCT 44.8 37.8 30.9* 28.0* 28.2*  MCV 89.4 88.9 91.4 92.1 91.3  PLT 159 167 150 128* 127*   Cardiac Enzymes: No results for input(s): "CKTOTAL", "CKMB", "CKMBINDEX", "TROPONINI" in the last 168 hours. BNP (last 3 results) No results for input(s): "PROBNP" in the last 8760 hours. CBG: No results for input(s): "GLUCAP" in the last 168 hours. D-Dimer: Recent Labs    07/20/21 0607  DDIMER 0.81*   Hgb A1c: No results for input(s): "HGBA1C" in the last 72 hours. Lipid Profile: No results for input(s): "CHOL", "HDL",  "LDLCALC", "TRIG", "CHOLHDL", "LDLDIRECT" in the last 72 hours. Thyroid function studies: No results for input(s): "TSH", "T4TOTAL", "T3FREE", "THYROIDAB" in the last 72 hours.  Invalid input(s): "FREET3" Anemia work up: Recent Labs    07/21/21 0533 07/21/21 1645  VITAMINB12  --  162*  FERRITIN 23  --   TIBC 230*  --   IRON 89  --  Sepsis Labs: Recent Labs  Lab 07/19/21 1310 07/20/21 0607 07/21/21 0533 07/22/21 0510  WBC 10.7* 7.6 6.6 6.5    Microbiology Recent Results (from the past 240 hour(s))  Chlamydia/NGC rt PCR ([place] only)     Status: None   Collection Time: 07/19/21  6:33 PM   Specimen: Cervical/Vaginal swab  Result Value Ref Range Status   Specimen source GC/Chlam ENDOC  Final   Chlamydia Tr NOT DETECTED NOT DETECTED Final   N gonorrhoeae NOT DETECTED NOT DETECTED Final    Comment: (NOTE) This CT/NG assay has not been evaluated in patients with a history of  hysterectomy. Performed at [facility], [address] [place], [place] [address]   Wet prep, genital     Status: Abnormal   Collection Time: 07/19/21  6:33 PM   Specimen: Cervical/Vaginal swab  Result Value Ref Range Status   Yeast Wet Prep HPF POC NONE SEEN NONE SEEN Final   Trich, Wet Prep NONE SEEN NONE SEEN Final   Clue Cells Wet Prep HPF POC NONE SEEN NONE SEEN Final   WBC, Wet Prep HPF POC >=10 (A) <10 Final   Sperm NONE SEEN  Final    Comment: Performed at [facility], [address] [place], [place] [address]  Urine Culture     Status: None   Collection Time: 07/20/21  7:02 AM   Specimen: Urine, Clean Catch  Result Value Ref Range Status   Specimen Description   Final    URINE, CLEAN CATCH Performed at [facility], [address] [place], [place] [address]    Special Requests   Final    NONE Performed at [facility], [address], [place], [place] [address]    Culture   Final    NO GROWTH Performed at [facility], [place] [address], [place], [place] [address]    Report Status 07/21/2021 FINAL  Final    Procedures and diagnostic studies:  DG HIP UNILAT WITH PELVIS 1V RIGHT  Result Date: 07/20/2021 CLINICAL DATA:  Rt hip and low back pain. No known injury.  [id number] EXAM: DG HIP (WITH OR WITHOUT PELVIS) 1V RIGHT COMPARISON:  None Available. FINDINGS: There is no evidence of hip fracture or dislocation of the right hip. No acute displaced fracture or dislocation of the left hip on frontal view. No acute displaced fracture or diastasis of the bones of the pelvis. There is no evidence of arthropathy or other focal bone abnormality. IMPRESSION: Negative. Electronically Signed   By: Iven Finn M.D.   On: 07/20/2021 20:27   DG Lumbar Spine Complete  Result Date: 07/20/2021 CLINICAL DATA:  Right hip and low back pain.  No known injury. EXAM: LUMBAR SPINE - COMPLETE 4+ VIEW COMPARISON:  None Available. FINDINGS: Mild scoliosis. Diffuse degenerative disc disease and facet disease throughout the lumbar spine. No fracture or subluxation. SI joints symmetric and unremarkable. IMPRESSION: Scoliosis and degenerative disc/facet disease. No acute bony abnormality. Electronically Signed   By: Rolm Baptise M.D.   On: 07/20/2021 20:27               LOS: 1 day   Dorse Locy  [facility]   Pager on [email or web address]. If 7PM-7AM, please contact night-coverage at [email or web address]     07/22/2021, 10:52 AM

## 2021-07-23 DIAGNOSIS — M5441 Lumbago with sciatica, right side: Secondary | ICD-10-CM

## 2021-07-23 NOTE — Progress Notes (Signed)
Received consult to assess previous IV site on right proximal forearm. Per flowsheet, this IV was removed yesterday due to leaking. Pt reports site has become tender with increased redness. Edema noted on inner proximal forearm; pt states this is new. Site is warm to touch. Spoke with RN. Recommend elevating arm and cold compress; recommend marking edges of edema to monitor for any progression. Advised pt to notify RN for any worsened symptoms.

## 2021-07-23 NOTE — Plan of Care (Signed)

## 2021-07-23 NOTE — Progress Notes (Signed)
Physical Therapy Treatment Patient Details Name: Sally Wilson MRN: [id number] DOB: 1951/01/05 Today's Date: 07/23/2021   History of Present Illness Sally Wilson is a 74yoF who [facility] to [facility] on [facility]21 with Rt flank pain. similar complaint on 07/14/21. CT scan shows a 6 mm nonobstructing stone with mild hydronephrosis. Other imaging reveals a number of chronic degenerative findings at the Rt hip joint, lumbar spine, and Right hip musculature.    PT Comments    Pt in bed, more alert, able to recognize author from 2 days prior. Pt motivated to get OOB today after several days unable to even sit at EOB due to severe pain. Pt reports good pain control at rest, but has intermittent Rt flank area pain that comes in waves (like spasm related) wherein she becomes anxious, impulsive and throws herself into bed in supine for pain control. Pt does well with sitting EOB. Log rolling without needed assist. Pt able to stand without LOB and without presyncope. Pt able to safely AMB to bathroom with RW, but is not able to find a position of comfort, attempts to sit ~10x before becoming nauseated and impulsively moving back to bed. Pt able to advance mobility, but pain remains easily provoked which ultimately limits tolerance to AMB activity. Encouraged OOB to chair later with NSG.     Recommendations for follow up therapy are one component of a multi-disciplinary discharge planning process, led by the attending physician.  Recommendations may be updated based on patient status, additional functional criteria and insurance authorization.  Follow Up Recommendations  Skilled nursing-short term rehab (<3 hours/day)     Assistance Recommended at Discharge    Patient can return home with the following A lot of help with bathing/dressing/bathroom;A lot of help with walking and/or transfers   Equipment Recommendations  Rolling walker (2 wheels)    Recommendations for Other Services       Precautions /  Restrictions Precautions Precautions: Fall Restrictions Weight Bearing Restrictions: No     Mobility  Bed Mobility Overal bed mobility: Needs Assistance Bed Mobility: Rolling, Sidelying to Sit, Sit to Supine Rolling: Supervision Sidelying to sit: Supervision   Sit to supine: Modified independent (Device/Increase time) (throws self into supine without warning for pain relief)        Transfers Overall transfer level: Needs assistance Equipment used: Rolling walker (2 wheels) Transfers: Sit to/from Stand Sit to Stand: From elevated surface                Ambulation/Gait Ambulation/Gait assistance: Min guard, Supervision Gait Distance (Feet): 10 Feet Assistive device: Rolling walker (2 wheels)         General Gait Details: appears steady, heavy UE support on RW for pain control   Stairs             Wheelchair Mobility    Modified Rankin (Stroke Patients Only)       Balance                                            Cognition Arousal/Alertness: Awake/alert Behavior During Therapy: WFL for tasks assessed/performed Overall Cognitive Status: Within Functional Limits for tasks assessed  Exercises      General Comments        Pertinent Vitals/Pain Pain Assessment Pain Assessment: No/denies pain Pain Score:  (well controlled at rest with severe cramping related pain when moving about in Rt hip area) Pain Location: lower back Pain Intervention(s): Limited activity within patient's tolerance, Monitored during session, Premedicated before session, Repositioned    Home Living                          Prior Function            PT Goals (current goals can now be found in the care plan section) Acute Rehab PT Goals Patient Stated Goal: improve her pain PT Goal Formulation: With patient Time For Goal Achievement: 08/03/21 Potential to Achieve Goals:  Fair Progress towards PT goals: Progressing toward goals    Frequency    Min 2X/week      PT Plan Current plan remains appropriate    Co-evaluation              AM-PAC PT "6 Clicks" Mobility   Outcome Measure  Help needed turning from your back to your side while in a flat bed without using bedrails?: A Little Help needed moving from lying on your back to sitting on the side of a flat bed without using bedrails?: A Little Help needed moving to and from a bed to a chair (including a wheelchair)?: A Little   Help needed to walk in hospital room?: A Little Help needed climbing 3-5 steps with a railing? : A Lot 6 Click Score: 14    End of Session Equipment Utilized During Treatment: Gait belt Activity Tolerance: Patient tolerated treatment well;No increased pain Patient left: in bed;with call bell/phone within reach;with bed alarm set Nurse Communication: Mobility status PT Visit Diagnosis: Unsteadiness on feet (R26.81);Difficulty in walking, not elsewhere classified (R26.2);Other abnormalities of gait and mobility (R26.89);Muscle weakness (generalized) (M62.81)     Time: 9562-1308 PT Time Calculation (min) (ACUTE ONLY): 25 min  Charges:  $Gait Training: 8-22 mins $Therapeutic Activity: 8-22 mins                 3:07 PM, 07/23/21 Etta Grandchild, PT, DPT Physical Therapist - [facility]  [phone] ([place])    Glenden Rossell C 07/23/2021, 2:57 PM

## 2021-07-23 NOTE — Progress Notes (Signed)
PASRR Number: [id number] Barrett Henle, [place]

## 2021-07-23 NOTE — Progress Notes (Signed)
Occupational Therapy Treatment Patient Details Name: Sally Wilson MRN: [id number] DOB: Nov 22, 1950 Today's Date: 07/23/2021   History of present illness Sally Wilson is a 65yoF who comes to [facility] on 07/18/21 with Rt flank pain. similar complaint on 07/14/21. CT scan shows a 6 mm nonobstructing stone with mild hydronephrosis. Other imaging reveals a number of chronic degenerative findings at the Rt hip joint, lumbar spine, and Right hip musculature.   OT comments  Pt seen for OT treatment on this date. Upon arrival to room pt awake and alert, laying in bed with family present. Pt agreeable to tx focused on upper and lower body bathing. Pt required SUPERVISION with vcs for sequencing log roll for supine<>sit and MIN A for STS and to maintain balance while in standing due to pain. Pt was able to tolerate ~5 min sitting at EOB w/ SUPERVISION and LUE support to perform grooming tasks. SUPERVISION with set up to perform upper body and lower body dressing in supine - pt limited by pain and not able to tolerate bathing at EOB. Pt making good progress toward goals. Pt continues to benefit from skilled OT services to maximize return to PLOF and minimize risk of future falls/injury. Will continue to follow POC. Discharge recommendation remains appropriate.     Recommendations for follow up therapy are one component of a multi-disciplinary discharge planning process, led by the attending physician.  Recommendations may be updated based on patient status, additional functional criteria and insurance authorization.    Follow Up Recommendations  Skilled nursing-short term rehab (<3 hours/day)    Assistance Recommended at Discharge Intermittent Supervision/Assistance  Patient can return home with the following  A lot of help with walking and/or transfers;A lot of help with bathing/dressing/bathroom;Assistance with cooking/housework;Direct supervision/assist for medications management;Help with stairs or ramp for  entrance;Direct supervision/assist for financial management;Assist for transportation   Equipment Recommendations  None recommended by OT    Recommendations for Other Services      Precautions / Restrictions Precautions Precautions: Fall Restrictions Weight Bearing Restrictions: No       Mobility Bed Mobility Overal bed mobility: Needs Assistance Bed Mobility: Supine to Sit, Sit to Supine, Rolling Rolling: Supervision   Supine to sit: Supervision Sit to supine: Supervision        Transfers Overall transfer level: Needs assistance Equipment used: 1 person hand held assist Transfers: Sit to/from Stand Sit to Stand: Min assist                 Balance Overall balance assessment: Needs assistance Sitting-balance support: No upper extremity supported, Feet supported Sitting balance-Leahy Scale: Good Sitting balance - Comments: Pt required LUE support to maintain sitting balance - limited by pain in R lower back   Standing balance support: Single extremity supported Standing balance-Leahy Scale: Fair Standing balance comment: Pt required MIN A for STS and to maintain balance while in standing - limited due to pain                           ADL either performed or assessed with clinical judgement   ADL Overall ADL's : Needs assistance/impaired                                       General ADL Comments: SUPERVISION with vcs for sequencing log roll for supine<>sit. Pt was able to tolerate ~5 min sitting  at EOB w/ SUPERVISION and LUE support to perform grooming tasks. SUPERVISION with set up to perform upper body and lower body dressing in supine - pt limited by pain and not able to tolerate bathing at EOB.      Cognition Arousal/Alertness: Awake/alert Behavior During Therapy: WFL for tasks assessed/performed Overall Cognitive Status: Within Functional Limits for tasks assessed                                                      Pertinent Vitals/ Pain       Pain Assessment Pain Assessment: Faces Faces Pain Scale: Hurts little more Pain Location: R lower back Pain Descriptors / Indicators: Grimacing, Discomfort, Guarding Pain Intervention(s): Limited activity within patient's tolerance, Monitored during session, Repositioned, Heat applied   Frequency  Min 2X/week        Progress Toward Goals  OT Goals(current goals can now be found in the care plan section)  Progress towards OT goals: Progressing toward goals  Acute Rehab OT Goals Patient Stated Goal: to improve pain OT Goal Formulation: With patient Time For Goal Achievement: 08/04/21 Potential to Achieve Goals: Good ADL Goals Pt Will Perform Grooming: with modified independence Pt Will Perform Lower Body Dressing: with min assist Pt Will Transfer to Toilet: with min assist Pt Will Perform Toileting - Clothing Manipulation and hygiene: with min assist  Plan Discharge plan remains appropriate;Frequency remains appropriate       AM-PAC OT "6 Clicks" Daily Activity     Outcome Measure   Help from another person eating meals?: None Help from another person taking care of personal grooming?: A Little Help from another person toileting, which includes using toliet, bedpan, or urinal?: A Little Help from another person bathing (including washing, rinsing, drying)?: A Little Help from another person to put on and taking off regular upper body clothing?: A Little Help from another person to put on and taking off regular lower body clothing?: A Little 6 Click Score: 19    End of Session Equipment Utilized During Treatment: Other (comment) (none)  OT Visit Diagnosis: Unsteadiness on feet (R26.81);Repeated falls (R29.6);Muscle weakness (generalized) (M62.81)   Activity Tolerance Patient limited by pain;Patient tolerated treatment well   Patient Left in bed;with call bell/phone within reach   Nurse Communication Other (comment) (K  pad and pain patch on)        Time: 0814-4818 OT Time Calculation (min): 26 min  Charges: OT General Charges $OT Visit: 1 Visit OT Treatments $Self Care/Home Management : 23-37 mins  [employer] OTDS  [place] Jeslin Bazinet 07/23/2021, 4:27 PM

## 2021-07-23 NOTE — Consult Note (Signed)
Referring Physician:  No referring provider defined for this encounter.  Primary Physician:  Idelle Crouch, MD  Chief Complaint:  R lower back pain  History of Present Illness: 07/23/2021 Sally Wilson is a 71 y.o. female who presents with the chief complaint of right lower back pain.  She has some gluteal pain but no radiating pain in her lower leg.  She has some tingling around the site of her pain in her lower back, but no numbness or tingling in her lower leg.  She has no weakness.  She has trouble sitting up due to pain in her lower back and right buttock.  The symptoms are causing a significant impact on the patient's life.   Review of Systems:  A 10 point review of systems is negative, except for the pertinent positives and negatives detailed in the HPI.  Past Medical History: Past Medical History:  Diagnosis Date   Arthritis    Current tobacco use    Dysplastic nevus 03/08/2021   left upper back paraspinal 2.5cm lat to spine- Excised 04/17/21   IBS (irritable bowel syndrome)    Kidney stone     Past Surgical History: Past Surgical History:  Procedure Laterality Date   BREAST BIOPSY Right 2005?   poss stereotatic bx neg   COLONOSCOPY WITH PROPOFOL N/A 11/02/2020   Procedure: COLONOSCOPY WITH PROPOFOL;  Surgeon: Lucilla Lame, MD;  Location: [facility] ENDOSCOPY;  Service: Endoscopy;  Laterality: N/A;   TUBAL LIGATION      Allergies: Allergies as of 07/19/2021 - Review Complete 07/19/2021  Allergen Reaction Noted   Cephalexin Swelling 08/21/2016   Codeine  03/06/2015   Penicillin g  11/20/2016    Medications:  Current Facility-Administered Medications:    acetaminophen (TYLENOL) tablet 650 mg, 650 mg, Oral, Q6H PRN **OR** acetaminophen (TYLENOL) suppository 650 mg, 650 mg, Rectal, Q6H PRN, Para Skeans, MD   acetaminophen (TYLENOL) tablet 1,000 mg, 1,000 mg, Oral, Once, Vanessa Richland, MD   baclofen (LIORESAL) tablet 10 mg, 10 mg, Oral, TID, Jennye Boroughs,  MD, 10 mg at 07/23/21 1517   clonazePAM (KLONOPIN) tablet 1 mg, 1 mg, Oral, Daily, Jennye Boroughs, MD, 1 mg at 07/22/21 2224   cyanocobalamin ((VITAMIN B-12)) injection 1,000 mcg, 1,000 mcg, Intramuscular, Daily, Beers, Shanon Brow, RPH, 1,000 mcg at 07/23/21 1039   enoxaparin (LOVENOX) injection 40 mg, 40 mg, Subcutaneous, QHS, Jennye Boroughs, MD, 40 mg at 07/22/21 2221   ferrous sulfate tablet 325 mg, 325 mg, Oral, Tonye Pearson, MD, 325 mg at 07/22/21 1202   HYDROmorphone (DILAUDID) tablet 1 mg, 1 mg, Oral, Q6H PRN, Florina Ou V, MD, 1 mg at 07/23/21 1333   lidocaine (LIDODERM) 5 % 1 patch, 1 patch, Transdermal, Q24H, Vanessa Dunellen, MD, 1 patch at 07/23/21 1333   morphine (PF) 2 MG/ML injection 2 mg, 2 mg, Intravenous, Q4H PRN, Jennye Boroughs, MD, 2 mg at 07/23/21 0915   nicotine (NICODERM CQ - dosed in mg/24 hours) patch 14 mg, 14 mg, Transdermal, Daily, Florina Ou V, MD, 14 mg at 07/23/21 0915   ondansetron (ZOFRAN) injection 4 mg, 4 mg, Intravenous, Q6H PRN, Jennye Boroughs, MD, 4 mg at 07/20/21 0821   pantoprazole (PROTONIX) EC tablet 40 mg, 40 mg, Oral, BID, Jennye Boroughs, MD, 40 mg at 07/23/21 0913   PARoxetine (PAXIL-CR) 24 hr tablet 25 mg, 25 mg, Oral, Daily, Florina Ou V, MD, 25 mg at 07/23/21 0913   sodium chloride flush (NS) 0.9 % injection 3 mL,  3 mL, Intravenous, Q12H, Para Skeans, MD, 3 mL at 07/23/21 0915   Social History: Social History   Tobacco Use   Smoking status: Some Days   Smokeless tobacco: Never   Tobacco comments:    5 cigarettes a day  Vaping Use   Vaping Use: Never used  Substance Use Topics   Alcohol use: No   Drug use: No    Family Medical History: Family History  Problem Relation Age of Onset   Breast cancer Maternal Aunt    Breast cancer Maternal Aunt     Physical Examination: Vitals:   07/23/21 0744 07/23/21 1630  BP: 110/65 (!) 107/51  Pulse: 77 73  Resp: 16 16  Temp: 98.7 F (37.1 C) 98.8 F (37.1 C)  SpO2: 96% 100%      General: Patient is well developed, well nourished, calm, collected, and in no apparent distress.  Psychiatric: Patient is non-anxious.  Head:  Pupils equal, round, and reactive to light.  ENT:  Oral mucosa appears well hydrated.  Neck:   Supple.  Full range of motion.  Respiratory: Patient is breathing without any difficulty.  Extremities: No edema.  Vascular: Palpable pulses in dorsal pedal vessels.  Skin:   On exposed skin, there are no abnormal skin lesions.  NEUROLOGICAL:  General: In no acute distress.   Awake, alert, oriented to person, place, and time.  Pupils equal round and reactive to light.  Facial tone is symmetric.  Tongue protrusion is midline.  There is no pronator drift.   Strength: Side Biceps Triceps Deltoid Interossei Grip Wrist Ext. Wrist Flex.  R '5 5 5 5 5 5 5  '$ L '5 5 5 5 5 5 5   '$ Side Iliopsoas Quads Hamstring PF DF EHL  R '5 5 5 5 5 5  '$ L '5 5 5 5 5 5    '$ Bilateral upper and lower extremity sensation is intact to light touch. Reflexes are 1+ and symmetric at the biceps, triceps, brachioradialis, patella and achilles. Hoffman's is absent.  Clonus is not present.  Toes are down-going.    Gait is untested due to pain. Imaging: MR L spine 07/19/21   IMPRESSION: Multilevel degenerative changes of the lumbar spine, summarized below:   T12-L1: Minimal spinal canal narrowing. Mild right neural foraminal stenosis.   L2: Minimal spinal canal narrowing. Mild left neural foraminal stenosis.   L2-L3: Mild retrolisthesis. Left greater than right subarticular stenosis and contact with the descending left L3 nerve root. Moderate left and mild right neural foraminal stenosis.   L3-L4: Right subarticular stenosis and contact with the descending right L4 nerve root. Mild bilateral neural foraminal stenosis.   L4-L5: Grade 1 anterolisthesis. Mild spinal canal stenosis. Moderate bilateral subarticular stenosis and contact with the descending L5 nerve  roots bilaterally.   L5-S1: Mild-to-moderate bilateral neural foraminal stenosis. No spinal canal stenosis.     Electronically Signed   By: Maurine Simmering M.D.   On: 07/19/2021 21:00  I have personally reviewed the images and agree with the above interpretation.  Labs:    Latest Ref Rng & Units 07/22/2021    5:10 AM 07/21/2021    5:33 AM 07/20/2021    6:07 AM  CBC  WBC 4.0 - 10.5 K/uL 6.5  6.6  7.6   Hemoglobin 12.0 - 15.0 g/dL 9.5  9.3  10.3   Hematocrit 36.0 - 46.0 % 28.2  28.0  30.9   Platelets 150 - 400 K/uL 127  128  150  Assessment and Plan: Ms. Koch is a pleasant 71 y.o. female with right lower back and buttock pain that could be radicular in nature or secondary to other causes such as hip pathology as seen on her MRI scan of the right hip.  At this point, I would recommend continuing conservative management with pain control, physical therapy, and follow-up as an outpatient.  She may be candidate for injections.  We will arrange that as an outpatient.  I would recommend consideration of steroids if that is felt to be appropriate medically.  She may not be a candidate given her coffee-ground emesis.    Osamah Schmader K. Izora Ribas MD, [place] [facility]. of Neurosurgery

## 2021-07-23 NOTE — Progress Notes (Signed)
Progress Note    Sally Wilson  XTG:[id number] DOB: September 12, 1950  DOA: 07/19/2021 PCP: Idelle Crouch, MD      Brief Narrative:    Medical records reviewed and are as summarized below:  Sally Wilson is a 71 y.o. female with medical history significant for IBS, tobacco use disorder, arthritis, vitamin B12 deficiency, who presented to the hospital because of severe pain on the right hip.  She had previously been to the emergency room on 2 occasions within the past week for similar complaints.  She came back to the hospital because of worsening right hip pain.  She also reported vomiting coffee-ground material.  She said she vomited about 3 times.  The first 2 vomitus was not bloody but the third vomitus looked like coffee-ground material.  No abdominal pain or rectal bleeding.  Of note, she said she haD been taking naproxen 750 mg daily for about 9 days.       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  Appreciate input from orthopedic surgeon.  Consulted Dr. Izora Ribas, neurosurgeon, per patient's request.  Continue PT and OT.  Awaiting placement to SNF.    Right nonobstructive nephrolithiasis: Outpatient follow-up with PCP  Hematemesis: Resolved.  Probably from recent vomiting.  This could also be from recent naproxen use.  Continue Protonix.  Recent colonoscopy in September 2022 showed colonic polyps.  Outpatient follow-up with gastroenterologist  Anemia, probably from acute blood loss anemia, iron deficiency, vitamin B12 deficiency: Vitamin B12 level was 162.   Ferritin 23, iron saturation 13, iron 89.  Continue vitamin B12 injections.  Hypokalemia: Improved.  Tobacco use disorder continue nicotine patch  Other comorbidities include anxiety, depression   Diet Order             Diet Heart Room service appropriate? Yes; Fluid consistency: Thin  Diet effective now                            Consultants: Orthopedic surgeon  Procedures: None    Medications:    acetaminophen  1,000 mg Oral Once   baclofen  10 mg Oral TID   clonazePAM  1 mg Oral Daily   cyanocobalamin  1,000 mcg Intramuscular Daily   enoxaparin (LOVENOX) injection  40 mg Subcutaneous QHS   ferrous sulfate  325 mg Oral QODAY   lidocaine  1 patch Transdermal Q24H   nicotine  14 mg Transdermal Daily   pantoprazole  40 mg Oral BID   PARoxetine  25 mg Oral Daily   sodium chloride flush  3 mL Intravenous Q12H   Continuous Infusions:     Anti-infectives (From admission, onward)    None              Family Communication/Anticipated D/C date and plan/Code Status   DVT prophylaxis: enoxaparin (LOVENOX) injection 40 mg Start: 07/22/21 2200     Code Status: Full Code  Family Communication: None Disposition Plan: Plan to discharge to SNF   Status is: Inpatient Remains inpatient appropriate because: Awaiting placement to SNF  Subjective:   She complains of right hip pain.  She feels a little better today.  She says she was able to get up to go to the bathroom with a walker.  She requested neurosurgery consult for further evaluation.  Objective:    Vitals:   07/23/21 0454 07/23/21 0615 07/23/21 0744 07/23/21 1630  BP: 110/62  110/65 (!) 107/51  Pulse: 80  77 73  Resp: '18  16 16  '$ Temp: 99.2 F (37.3 C)  98.7 F (37.1 C) 98.8 F (37.1 C)  TempSrc: Oral     SpO2: 94%  96% 100%  Weight:  62.1 kg    Height:  5' 6.5" (1.689 m)     No data found.   Intake/Output Summary (Last 24 hours) at 07/23/2021 1633 Last data  filed at 07/23/2021 1629 Gross per 24 hour  Intake 963 ml  Output 1200 ml  Net -237 ml   Filed Weights   07/21/21 0500 07/23/21 0615  Weight: 64.3 kg 62.1 kg    Exam:  GEN: NAD SKIN: No rash EYES: EOMI ENT: MMM CV: RRR PULM: CTA B ABD: soft, ND, NT, +BS CNS: AAO x 3, non focal EXT: No edema or tenderness MSK: Right hip tenderness without erythema or swelling      Data Reviewed:   I have personally reviewed following labs and imaging studies:  Labs: Labs show the following:   Basic Metabolic Panel: Recent Labs  Lab 07/18/21 1348 07/19/21 1310 07/20/21 0607 07/21/21 0533  NA 139 137 141 141  K 3.1* 3.3* 3.2* 3.7  CL 104 104 111 110  CO2 '28 27 29 28  '$ GLUCOSE 126* 126* 100* 93  BUN 18 47* 32* 19  CREATININE 0.90 0.76 0.67 0.64  CALCIUM 8.9 8.5* 8.2* 8.4*  MG  --   --  2.1  --    GFR Estimated Creatinine Clearance: 61.6 mL/min (by C-G formula based on SCr of 0.64 mg/dL). Liver Function Tests: Recent Labs  Lab 07/18/21 1348 07/19/21 1310 07/20/21 0607  AST 17 11* 10*  ALT '19 17 11  '$ ALKPHOS 59 43 37*  BILITOT 0.9 0.5 0.5  PROT 6.5 5.7* 5.2*  ALBUMIN 3.7 3.3* 2.9*   Recent Labs  Lab 07/18/21 1348 07/19/21 1310  LIPASE 28 25   No results for input(s): "AMMONIA" in the last 168 hours. Coagulation profile No results for input(s): "INR", "PROTIME" in the last 168 hours.  CBC: Recent Labs  Lab 07/18/21 1348 07/19/21 1310 07/20/21 0607 07/21/21 0533 07/22/21 0510  WBC 8.4 10.7* 7.6 6.6 6.5  NEUTROABS 6.9  --   --   --   --   HGB 15.5* 12.8 10.3* 9.3* 9.5*  HCT 44.8 37.8 30.9* 28.0* 28.2*  MCV 89.4 88.9 91.4 92.1 91.3  PLT 159 167 150 128* 127*   Cardiac Enzymes: No results for input(s): "CKTOTAL", "CKMB", "CKMBINDEX", "TROPONINI" in the last 168 hours. BNP (last 3 results) No results for input(s): "PROBNP" in the last 8760 hours. CBG: No results for input(s): "GLUCAP" in the last 168 hours. D-Dimer: No results for input(s): "DDIMER"  in the last 72 hours.  Hgb A1c: No results for input(s): "HGBA1C" in the last 72 hours. Lipid Profile: No results for input(s): "CHOL", "HDL", "LDLCALC", "TRIG", "CHOLHDL", "LDLDIRECT" in the last 72 hours. Thyroid function studies: No results for input(s): "TSH", "T4TOTAL", "T3FREE", "THYROIDAB" in the last 72 hours.  Invalid input(s): "FREET3" Anemia work up: Recent Labs    07/21/21 0533 07/21/21 [place]  --  162*  FERRITIN 23  --   TIBC 230*  --   IRON 89  --    Sepsis Labs: Recent Labs  Lab 07/19/21 1310 07/20/21 0607 07/21/21 0533 07/22/21 0510  WBC 10.7* 7.6 6.6 6.5    Microbiology Recent Results (from the past 240 hour(s))  Chlamydia/NGC rt PCR ([facility] only)     Status: None   Collection Time: 07/19/21  6:33 PM   Specimen: Cervical/Vaginal swab  Result Value Ref Range Status   Specimen source GC/Chlam ENDOC  Final   Chlamydia Tr NOT DETECTED NOT DETECTED Final   N gonorrhoeae NOT DETECTED NOT DETECTED Final    Comment: (NOTE) This CT/NG assay has not been evaluated in patients with a history of  hysterectomy. Performed at [facility], [address] [place], [place] [address]   Wet prep, genital     Status: Abnormal   Collection Time: 07/19/21  6:33 PM   Specimen: Cervical/Vaginal swab  Result Value Ref Range Status   Yeast Wet Prep HPF POC NONE SEEN NONE SEEN Final   Trich, Wet Prep NONE SEEN NONE SEEN Final   Clue Cells Wet Prep HPF POC NONE SEEN NONE SEEN Final   WBC, Wet Prep HPF POC >=10 (A) <10 Final   Sperm NONE SEEN  Final    Comment: Performed at [facility], [address] [place], [place] [address]  Urine Culture     Status: None   Collection Time: 07/20/21  7:02 AM   Specimen: Urine, Clean Catch  Result Value Ref Range Status   Specimen Description   Final    URINE, CLEAN CATCH Performed at [facility], [address], [place], [place] [address]    Special Requests   Final    NONE Performed  at [facility], [address], [place], [place] [address]    Culture   Final    NO GROWTH Performed at [facility], [place] [address] [place], [place] [address]    Report Status 07/21/2021 FINAL  Final    Procedures and diagnostic studies:  No results found.             LOS: 2 days   Sally Wilson  [facility]   Pager on [email or web address]. If 7PM-7AM, please contact night-coverage at [email or web address]     07/23/2021, 4:33 PM

## 2021-07-24 MED ORDER — FERROUS SULFATE 325 (65 FE) MG PO TABS: 325.0000 mg | ORAL_TABLET | ORAL | 3 refills | Status: AC

## 2021-07-24 MED ORDER — CLONAZEPAM 1 MG PO TABS: 1.0000 mg | ORAL_TABLET | Freq: Every day | ORAL | 0 refills | Status: AC

## 2021-07-24 MED ORDER — BISACODYL 10 MG RE SUPP
10.0000 mg | Freq: Once | RECTAL | Status: AC
  Administered 2021-07-24: 10 mg via RECTAL
  Filled 2021-07-24: qty 1

## 2021-07-24 MED ORDER — BACLOFEN 10 MG PO TABS: 10.0000 mg | ORAL_TABLET | Freq: Three times a day (TID) | ORAL | 0 refills | Status: AC

## 2021-07-24 MED ORDER — NICOTINE 14 MG/24HR TD PT24: 14.0000 mg | MEDICATED_PATCH | Freq: Every day | TRANSDERMAL | 0 refills | Status: AC

## 2021-07-24 MED ORDER — OXYCODONE HCL 5 MG PO TABS: 5.0000 mg | ORAL_TABLET | Freq: Three times a day (TID) | ORAL | 0 refills | Status: AC | PRN

## 2021-07-24 MED ORDER — POLYETHYLENE GLYCOL 3350 17 G PO PACK: 17.0000 g | PACK | Freq: Every day | ORAL | Status: DC | PRN

## 2021-07-24 MED ORDER — SENNOSIDES-DOCUSATE SODIUM 8.6-50 MG PO TABS
1.0000 | ORAL_TABLET | Freq: Once | ORAL | Status: AC
Start: 2021-07-24 — End: 2021-07-24
  Administered 2021-07-24: 1 via ORAL
  Filled 2021-07-24: qty 1

## 2021-07-24 MED ORDER — VITAMIN B-12 1000 MCG PO TABS: 1000.0000 ug | ORAL_TABLET | Freq: Every day | ORAL | Status: AC

## 2021-07-24 MED ORDER — POLYETHYLENE GLYCOL 3350 17 G PO PACK: 17.0000 g | PACK | Freq: Every day | ORAL | 0 refills | Status: AC | PRN

## 2021-07-24 NOTE — Progress Notes (Signed)
Discharge note: Pt given discharge instructions and verbalized understanding. Pt wheeled out by staff with all valuables and left via car driven by sister who will take her over to [facility] from hospital.

## 2021-07-24 NOTE — TOC Progression Note (Signed)
Transition of Care [facility]) - Progression Note    Patient Details  Name: Sally Wilson MRN: [id number] Date of Birth: 1950-08-25  Transition of Care [facility]) CM/SW [place], RN Phone Number: 07/24/2021, 9:41 AM  Clinical Narrative:    Ins approved to go to [employer] [id number] 6/13-6/15   Expected Discharge Plan: [place] Barriers to Discharge: Continued Medical Work up  Expected Discharge Plan and Services Expected Discharge Plan: [place] arrangements for the past 2 months: Single Family Home                                       Social Determinants of Health (SDOH) Interventions    Readmission Risk Interventions    07/22/2021    1:07 PM  Readmission Risk Prevention Plan  Post Dischage Appt Complete  Medication Screening Complete  Transportation Screening Complete

## 2021-07-24 NOTE — Discharge Summary (Signed)
Physician Discharge Summary   Patient: Sally Wilson MRN: [id number] DOB: November 12, 1950  Admit date:     07/19/2021  Discharge date: 07/24/21  Discharge Physician: Jennye Boroughs   PCP: Idelle Crouch, MD   Recommendations at discharge:   Follow up with PCP in 2 weeks  Follow-up with Dr. Cari Caraway, neurosurgeon, in 2 weeks  Follow-up with gastroenterologist with [facility] in 2 weeks  Discharge Diagnoses: Principal Problem:   Back pain Active Problems:   Hip pain, chronic, right   Tobacco abuse   Heart palpitations   Coffee ground emesis   Hypokalemia   Vitamin B12 deficiency  Resolved Problems:   * No resolved hospital problems. [facility] Course:  Ms. Sally Wilson is a 71 y.o. female with medical history significant for IBS, tobacco use disorder, arthritis, vitamin B12 deficiency, who presented to the hospital because of severe pain on the right hip.  She had previously been to the emergency room on 2 occasions within the past week for similar complaints.  She came back to the hospital because of worsening right hip pain.  She also reported vomiting coffee-ground material.  She said she vomited about 3 times.  The first 2 vomitus was not bloody but the third vomitus looked like coffee-ground material.  No abdominal pain or rectal bleeding.  Of note, she said she had been taking naproxen 750 mg daily for about 9 days prior to admission.   She was admitted to the hospital for severe right hip pain, back pain and hematemesis.  Imaging including MRI hip and MRI lumbar spine showed moderate right hip arthritis, right gluteus minimus tendon tear, right peritrochanteric edema, degenerative disc disease lumbar spine, multilevel foraminal or canal stenosis from T12-S1.  She was treated with analgesics, IV fluids and IV Protonix.  She was evaluated by orthopedic surgeon and neurosurgeon who recommended conservative management and outpatient follow-up.  PT and OT  recommended further rehabilitation at a skilled nursing facility.    She has vitamin B12 deficiency with vitamin B12 level of 162.  She was treated with vitamin B12 injections and transitioned to oral vitamin B12.  She also had mild acute blood loss anemia from hematemesis.  Etiology of her hematemesis is unclear.  Differential diagnoses include Mallory-Weiss tear from vomiting, peptic ulcer disease, NSAID use.  She was started on ferrous sulfate for iron deficiency anemia.  Outpatient follow-up with GI was recommended.  Her condition has improved and she is deemed stable for discharge to SNF today.  Discharge plan was discussed with the patient and her boyfriend at the bedside.  All their questions were answered.     Pain control - [employer] Controlled Substance Reporting System database was reviewed. and patient was instructed, not to drive, operate heavy machinery, perform activities at heights, swimming or participation in water activities or provide baby-sitting services while on Pain, Sleep and Anxiety Medications; until their outpatient Physician has advised to do so again. Also recommended to not to take more than prescribed Pain, Sleep and Anxiety Medications.  Consultants: Orthopedic surgeon, neurosurgeon Procedures performed: None Disposition: Skilled nursing facility Diet recommendation:  Discharge Diet Orders (From admission, onward)     Start     Ordered   07/24/21 0000  Diet general        07/24/21 1203           Regular diet DISCHARGE MEDICATION: Allergies as of 07/24/2021       Reactions   Cephalexin Swelling  Thrush of mouth and genitalia. Swelling Stomach cramping. Thrush of mouth and genitalia. Swelling Stomach cramping. Thrush of mouth and genitalia. Swelling Stomach cramping.   Codeine    Other reaction(s): Other (See Comments)   Penicillin G    Other reaction(s): Other (See Comments)        Medication List     STOP taking these medications     HYDROmorphone 2 MG tablet Commonly known as: DILAUDID   Narcosoft Herbal Lax Caps   predniSONE 10 MG (21) Tbpk tablet Commonly known as: STERAPRED UNI-PAK 21 TAB       TAKE these medications    acetaminophen 325 MG tablet Commonly known as: TYLENOL Take 650 mg by mouth every 6 (six) hours as needed for mild pain.   baclofen 10 MG tablet Commonly known as: LIORESAL Take 1 tablet (10 mg total) by mouth 3 (three) times daily for 5 days.   clonazePAM 1 MG tablet Commonly known as: KLONOPIN Take 1 tablet (1 mg total) by mouth daily.   ferrous sulfate 325 (65 FE) MG tablet Take 1 tablet (325 mg total) by mouth every other day. Start taking on: July 26, 2021   mometasone 0.1 % cream Commonly known as: ELOCON Apply 1 application topically daily as needed (Rash). Qd to aa right ear prn flares   mupirocin ointment 2 % Commonly known as: BACTROBAN Apply 1 application. topically daily. Qd to excision site   nicotine 14 mg/24hr patch Commonly known as: NICODERM CQ - dosed in mg/24 hours Place 1 patch (14 mg total) onto the skin daily. Start taking on: July 25, 2021   ondansetron 4 MG disintegrating tablet Commonly known as: ZOFRAN-ODT Take 1 tablet (4 mg total) by mouth every 8 (eight) hours as needed for nausea or vomiting.   oxyCODONE 5 MG immediate release tablet Commonly known as: Roxicodone Take 1 tablet (5 mg total) by mouth every 8 (eight) hours as needed for up to 3 days for severe pain.   Paxil CR 25 MG 24 hr tablet Generic drug: PARoxetine Take 25 mg by mouth daily.   polyethylene glycol 17 g packet Commonly known as: MIRALAX / GLYCOLAX Take 17 g by mouth daily as needed for mild constipation.   tiZANidine 4 MG tablet Commonly known as: ZANAFLEX Take 4 mg by mouth 3 (three) times daily as needed.   traZODone 100 MG tablet Commonly known as: DESYREL Take 100 mg by mouth at bedtime.   vitamin B-12 1000 MCG tablet Commonly known as:  CYANOCOBALAMIN Take 1 tablet (1,000 mcg total) by mouth daily.        Contact information for follow-up providers     Meade Maw, MD. Schedule an appointment as soon as possible for a visit in 2 week(s).   Specialty: Neurosurgery Contact information: [place] [address] [place]. Schedule an appointment as soon as possible for a visit in 2 week(s).   Specialty: Gastroenterology Why: recent hematemesis on NSAIDs Contact information: [address] [place] [address] [phone]             Contact information for after-discharge care     [facility] Preferred SNF .   Service: Skilled Chiropodist information: [place] [phone]  Discharge Exam: Filed Weights   07/21/21 0500 07/23/21 0615  Weight: 64.3 kg 62.1 kg   GEN: NAD SKIN: No rash EYES: EOMI ENT: MMM CV: RRR PULM: CTA B ABD: soft, ND, NT, +BS CNS: AAO x 3, non focal EXT: Right hip tenderness   Condition at discharge: good  The results of significant diagnostics from this hospitalization (including imaging, microbiology, ancillary and laboratory) are listed below for reference.   Imaging Studies: DG HIP UNILAT WITH PELVIS 1V RIGHT  Result Date: 07/20/2021 CLINICAL DATA:  Rt hip and low back pain. No known injury.  [id number] EXAM: DG HIP (WITH OR WITHOUT PELVIS) 1V RIGHT COMPARISON:  None Available. FINDINGS: There is no evidence of hip fracture or dislocation of the right hip. No acute displaced fracture or dislocation of the left hip on frontal view. No acute displaced fracture or diastasis of the bones of the pelvis. There is no evidence of arthropathy or other focal bone abnormality. IMPRESSION: Negative. Electronically Signed    By: Iven Finn M.D.   On: 07/20/2021 20:27   DG Lumbar Spine Complete  Result Date: 07/20/2021 CLINICAL DATA:  Right hip and low back pain.  No known injury. EXAM: LUMBAR SPINE - COMPLETE 4+ VIEW COMPARISON:  None Available. FINDINGS: Mild scoliosis. Diffuse degenerative disc disease and facet disease throughout the lumbar spine. No fracture or subluxation. SI joints symmetric and unremarkable. IMPRESSION: Scoliosis and degenerative disc/facet disease. No acute bony abnormality. Electronically Signed   By: Rolm Baptise M.D.   On: 07/20/2021 20:27   MR LUMBAR SPINE WO CONTRAST  Result Date: 07/19/2021 CLINICAL DATA:  Low back pain, infection suspected, positive xray/CT EXAM: MRI LUMBAR SPINE WITHOUT CONTRAST TECHNIQUE: Multiplanar, multisequence MR imaging of the lumbar spine was performed. No intravenous contrast was administered. COMPARISON:  None Available. FINDINGS: Segmentation:  Standard. Alignment: There is mild retrolisthesis at L2-L3. Grade 1 anterolisthesis at L4-L5. Vertebrae: There are multilevel degenerative endplate changes most prominent at L1-L2 and L2-L3. There is no evidence of acute fracture, discitis, or aggressive osseous lesion. Conus medullaris and cauda equina: Conus extends to the L1-L2 level. Conus and cauda equina appear normal. Paraspinal and other soft tissues: Tiny left renal cyst which requires no follow-up. Lower paraspinal muscle atrophy. Disc levels: T12-L1: Minimal disc bulging. Ligamentum flavum hypertrophy and bilateral facet arthropathy. There is minimal spinal canal narrowing. Mild right neural foraminal stenosis. L1-L2: Minimal disc bulging. Ligamentum flavum hypertrophy and mild facet arthropathy. Minimal spinal canal narrowing. Mild left neural foraminal stenosis. L2-L3: Mild retrolisthesis. Ligament flavum hypertrophy and bilateral facet arthropathy with bilateral facet effusions. Minimal disc bulging and endplate spurring. There is left greater than right  subarticular stenosis and contact with the descending left L3 nerve root. There is moderate left neural foraminal stenosis. Mild right neural foraminal stenosis. L3-L4: Minimal asymmetric left disc bulging. Ligamentum flavum hypertrophy and bilateral facet arthropathy. There is mild right subarticular stenosis and contact with the descending right L4 nerve root. There is mild bilateral neural foraminal stenosis. L4-L5: Grade 1 anterolisthesis with minimal disc bulging. Ligamentum flavum hypertrophy and bilateral facet arthropathy with right greater left facet effusions. There is mild spinal canal stenosis with moderate bilateral subarticular stenosis and contact with the descending L5 nerve roots bilaterally. L5-S1: Mild disc bulging. Ligament flavum hypertrophy and bilateral facet arthropathy. No spinal canal stenosis. There is mild-to-moderate bilateral neural foraminal stenosis. IMPRESSION: Multilevel degenerative changes of the lumbar spine, summarized below: T12-L1: Minimal spinal canal narrowing. Mild right neural foraminal stenosis. L2: Minimal spinal  canal narrowing. Mild left neural foraminal stenosis. L2-L3: Mild retrolisthesis. Left greater than right subarticular stenosis and contact with the descending left L3 nerve root. Moderate left and mild right neural foraminal stenosis. L3-L4: Right subarticular stenosis and contact with the descending right L4 nerve root. Mild bilateral neural foraminal stenosis. L4-L5: Grade 1 anterolisthesis. Mild spinal canal stenosis. Moderate bilateral subarticular stenosis and contact with the descending L5 nerve roots bilaterally. L5-S1: Mild-to-moderate bilateral neural foraminal stenosis. No spinal canal stenosis. Electronically Signed   By: Maurine Simmering M.D.   On: 07/19/2021 21:00   MR HIP RIGHT WO CONTRAST  Result Date: 07/19/2021 CLINICAL DATA:  Fracture, hip EXAM: MR OF THE RIGHT HIP WITHOUT CONTRAST TECHNIQUE: Multiplanar, multisequence MR imaging was performed.  No intravenous contrast was administered. COMPARISON:  None Available. FINDINGS: Bones: There is no evidence of acute fracture, dislocation or avascular necrosis. No focal bone lesion. Mild degenerative changes of the SI joints. The pubic symphysis is unremarkable. Lower lumbar spine degenerative disc disease, partially visualized. 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 Articular cartilage and labrum Articular cartilage:  Moderate chondrosis. Labrum: There is degenerative superior labral tearing anteriorly and posteriorly. Joint or bursal effusion Joint effusion: No significant hip joint effusion. Bursae: No evidence of trochanteric bursitis. Muscles and tendons Muscles and tendons: There is tendinosis and probable chronic insertional partial tearing of the distal gluteus minimus tendon at the greater trochanter. There is mild peritrochanteric edema. Similar findings along the left hip. There is tendinosis of the proximal hamstrings bilaterally.There is edema within the quadratus femoris muscles bilaterally with narrowed ischiofemoral space. There is some reactive muscle edema in the hip adductors bilaterally and in the right gluteus maximus. There is a benign tiny enchondroma in the left proximal femur. Other findings Miscellaneous: Colonic diverticulosis. IMPRESSION: No evidence of acute hip fracture. Moderate right hip osteoarthritis with degenerative superior labral tearing anteriorly and posteriorly. 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 Mild peritrochanteric edema without significant bursitis. Mild findings of ischiofemoral impingement bilaterally. Electronically Signed   By: Maurine Simmering M.D.   On: 07/19/2021 20:50   CT ABDOMEN PELVIS W CONTRAST  Result Date: 07/19/2021 CLINICAL DATA:  Flank pain EXAM: CT ABDOMEN AND PELVIS WITH CONTRAST TECHNIQUE: Multidetector CT imaging of the abdomen and pelvis was performed using the standard protocol following bolus  administration of intravenous contrast. RADIATION DOSE REDUCTION: This exam was performed according to the departmental dose-optimization program which includes automated exposure control, adjustment of the mA and/or kV according to patient size and/or use of iterative reconstruction technique. CONTRAST:  172m OMNIPAQUE IOHEXOL 300 MG/ML  SOLN COMPARISON:  07/18/2021 FINDINGS: Lower chest: No acute abnormality. Hepatobiliary: No solid liver abnormality is seen. No gallstones, gallbladder wall thickening, or biliary dilatation. Pancreas: Unremarkable. No pancreatic ductal dilatation or surrounding inflammatory changes. Spleen: Normal in size without significant abnormality. Adrenals/Urinary Tract: Adrenal glands are unremarkable. Small nonobstructive calculus of the inferior pole of the right kidney. No left-sided calculi, ureteral calculi, or hydronephrosis. Bladder is unremarkable. Stomach/Bowel: Stomach is within normal limits. Appendix appears normal. No evidence of bowel wall thickening, distention, or inflammatory changes. Descending and sigmoid diverticulosis. Moderate burden of stool throughout the colon and rectum. Vascular/Lymphatic: Aortic atherosclerosis. No enlarged abdominal or pelvic lymph nodes. Reproductive: Small calcified uterine fibroids. Other: No abdominal wall hernia or abnormality. No ascites. Musculoskeletal: No acute or significant osseous findings. IMPRESSION: 1. No acute CT findings of the abdomen or pelvis to explain pain. 2. Small  nonobstructive calculus of the inferior pole of the right kidney. No left-sided calculi, ureteral calculi, or hydronephrosis. 3. Descending and sigmoid diverticulosis without evidence of acute diverticulitis. 4. Moderate burden of stool throughout the colon and rectum. Aortic Atherosclerosis (ICD10-I70.0). Electronically Signed   By: Delanna Ahmadi M.D.   On: 07/19/2021 16:24   CT Renal Stone Study  Result Date: 07/18/2021 CLINICAL DATA:  Right flank and  back pain x1 we EXAM: CT ABDOMEN AND PELVIS WITHOUT CONTRAST TECHNIQUE: Multidetector CT imaging of the abdomen and pelvis was performed following the standard protocol without IV contrast. RADIATION DOSE REDUCTION: This exam was performed according to the departmental dose-optimization program which includes automated exposure control, adjustment of the mA and/or kV according to patient size and/or use of iterative reconstruction technique. COMPARISON:  None Available. FINDINGS: Lower chest: No pleural or pericardial effusion. Linear scarring or atelectasis at the left lung base. Hepatobiliary: No focal liver abnormality is seen. No gallstones, gallbladder wall thickening, or biliary dilatation. Pancreas: Unremarkable. No pancreatic ductal dilatation or surrounding inflammatory changes. Spleen: Normal in size without focal abnormality. Adrenals/Urinary Tract: No adrenal mass. Symmetric renal contours. 6 mm right lower pole renal calculus. No hydronephrosis. No definite ureteral calculus. Urinary bladder physiologically distended. Stomach/Bowel: Stomach is nondistended. The small bowel is nondilated. Normal appendix. The colon is nondilated. A few descending segment diverticula without adjacent inflammatory change. Vascular/Lymphatic: Mild scattered aortoiliac calcified atheromatous plaque without aneurysm. No abdominal or pelvic adenopathy. Reproductive: Coarse uterine calcifications probably degenerated fibroids. Bilateral coarse adnexal calcifications. No adnexal mass. Other: No ascites.  No free air. Musculoskeletal: Multilevel lower thoracic and lumbar spondylitic change. No fracture or worrisome bone lesion. IMPRESSION: 1. No acute findings. 2. Nonobstructive right urolithiasis. 3. Descending colon diverticulosis. 4.  Aortic Atherosclerosis (ICD10-170.0). Electronically Signed   By: Lucrezia Europe M.D.   On: 07/18/2021 13:33    Microbiology: Results for orders placed or performed during the hospital encounter  of 07/19/21  [place] rt PCR ([place] only)     Status: None   Collection Time: 07/19/21  6:33 PM   Specimen: Cervical/Vaginal swab  Result Value Ref Range Status   Specimen source GC/Chlam ENDOC  Final   Chlamydia Tr NOT DETECTED NOT DETECTED Final   N gonorrhoeae NOT DETECTED NOT DETECTED Final    Comment: (NOTE) This CT/NG assay has not been evaluated in patients with a history of  hysterectomy. Performed at [facility], [address] [place], [place] [address]   Wet prep, genital     Status: Abnormal   Collection Time: 07/19/21  6:33 PM   Specimen: Cervical/Vaginal swab  Result Value Ref Range Status   Yeast Wet Prep HPF POC NONE SEEN NONE SEEN Final   Trich, Wet Prep NONE SEEN NONE SEEN Final   Clue Cells Wet Prep HPF POC NONE SEEN NONE SEEN Final   WBC, Wet Prep HPF POC >=10 (A) <10 Final   Sperm NONE SEEN  Final    Comment: Performed at [facility], [address] [place], [place] [address]  Urine Culture     Status: None   Collection Time: 07/20/21  7:02 AM   Specimen: Urine, Clean Catch  Result Value Ref Range Status   Specimen Description   Final    URINE, CLEAN CATCH Performed at [facility], [address] [place], [place] [address]    Special Requests   Final    NONE Performed at [facility], [address] [place], [place] [address]  Culture   Final    NO GROWTH Performed at [facility], [place] [address], [place], [place] [address]    Report Status 07/21/2021 FINAL  Final    Labs: CBC: Recent Labs  Lab 07/18/21 1348 07/19/21 1310 07/20/21 0607 07/21/21 0533 07/22/21 0510  WBC 8.4 10.7* 7.6 6.6 6.5  NEUTROABS 6.9  --   --   --   --   HGB 15.5* 12.8 10.3* 9.3* 9.5*  HCT 44.8 37.8 30.9* 28.0* 28.2*  MCV 89.4 88.9 91.4 92.1 91.3  PLT 159 167 150 128* 675*   Basic Metabolic Panel: Recent Labs  Lab 07/18/21 1348 07/19/21 1310 07/20/21 0607 07/21/21 0533  NA 139 137 141 141  K 3.1* 3.3*  3.2* 3.7  CL 104 104 111 110  CO2 '28 27 29 28  '$ GLUCOSE 126* 126* 100* 93  BUN 18 47* 32* 19  CREATININE 0.90 0.76 0.67 0.64  CALCIUM 8.9 8.5* 8.2* 8.4*  MG  --   --  2.1  --    Liver Function Tests: Recent Labs  Lab 07/18/21 1348 07/19/21 1310 07/20/21 0607  AST 17 11* 10*  ALT '19 17 11  '$ ALKPHOS 59 43 37*  BILITOT 0.9 0.5 0.5  PROT 6.5 5.7* 5.2*  ALBUMIN 3.7 3.3* 2.9*   CBG: No results for input(s): "GLUCAP" in the last 168 hours.  Discharge time spent: greater than 30 minutes.  Signed: Jennye Boroughs, MD [facility] 07/24/2021

## 2021-07-24 NOTE — Progress Notes (Signed)
Physical Therapy Treatment Patient Details Name: Sally Wilson MRN: [id number] DOB: 11/11/1950 Today's Date: 07/24/2021   History of Present Illness Pt is a 37yoF who comes to [facility] on 07/18/21 with Rt flank pain. similar complaint on 07/14/21. CT scan shows a 6 mm nonobstructing stone with mild hydronephrosis. Other imaging reveals a number of chronic degenerative findings at the Rt hip joint, lumbar spine, and Right hip musculature.    PT Comments    Pt was pleasant and motivated to participate during the session and put forth good effort throughout. Pt is able to complete bed mobility w/ supervision using bedrails to help manage pain. Pt is to complete sit to stands CGA w/ good BLE strength and with min UE support to RW. Pt was able to ambulate around room 8f x2 w/ RW CGA and with heavy UE support to help manage pain. Pain increased during walk to 9/10 with pt needing to move to supine very quickly to help relieve pain but was able to subside after a few minutes of rest. Pt will benefit from PT services in a SNF setting upon discharge to safely address deficits listed in patient problem list for decreased caregiver assistance and eventual return to PLOF.   Recommendations for follow up therapy are one component of a multi-disciplinary discharge planning process, led by the attending physician.  Recommendations may be updated based on patient status, additional functional criteria and insurance authorization.  Follow Up Recommendations  Skilled nursing-short term rehab (<3 hours/day)     Assistance Recommended at Discharge Intermittent Supervision/Assistance  Patient can return home with the following A little help with walking and/or transfers;A little help with bathing/dressing/bathroom;Assistance with cooking/housework;Help with stairs or ramp for entrance   Equipment Recommendations  Rolling walker (2 wheels)    Recommendations for Other Services       Precautions / Restrictions  Precautions Precautions: Fall Restrictions Weight Bearing Restrictions: No     Mobility  Bed Mobility Overal bed mobility: Needs Assistance Bed Mobility: Supine to Sit Rolling: Supervision   Supine to sit: Supervision Sit to supine: Supervision        Transfers Overall transfer level: Needs assistance Equipment used: Rolling walker (2 wheels) Transfers: Sit to/from Stand Sit to Stand: Min guard           General transfer comment: pt able to complete w/ good strength in BLE and min UE support to RW    Ambulation/Gait Ambulation/Gait assistance: Min guard   Assistive device: Rolling walker (2 wheels) Gait Pattern/deviations: Step-to pattern, Decreased step length - right, Decreased step length - left       General Gait Details: heavy UE support to RW for pain control   Stairs             Wheelchair Mobility    Modified Rankin (Stroke Patients Only)       Balance Overall balance assessment: Needs assistance Sitting-balance support: No upper extremity supported, Feet supported Sitting balance-Leahy Scale: Good     Standing balance support: Bilateral upper extremity supported, During functional activity Standing balance-Leahy Scale: Good                              Cognition Arousal/Alertness: Awake/alert Behavior During Therapy: WFL for tasks assessed/performed Overall Cognitive Status: Within Functional Limits for tasks assessed  Exercises General Exercises - Lower Extremity Long Arc Quad: Strengthening, Both, 10 reps (manual resistance) Education provided on physiological benefits of activity   General Comments        Pertinent Vitals/Pain Pain Assessment Pain Assessment: 0-10 Pain Score: 6  Pain Location: R lower back Pain Descriptors / Indicators: Grimacing, Discomfort, Guarding Pain Intervention(s): Monitored during session, Premedicated before session,  Repositioned    Home Living                          Prior Function            PT Goals (current goals can now be found in the care plan section) Progress towards PT goals: Progressing toward goals    Frequency    Min 2X/week      PT Plan Current plan remains appropriate    Co-evaluation              AM-PAC PT "6 Clicks" Mobility   Outcome Measure  Help needed turning from your back to your side while in a flat bed without using bedrails?: A Little Help needed moving from lying on your back to sitting on the side of a flat bed without using bedrails?: A Little Help needed moving to and from a bed to a chair (including a wheelchair)?: A Little Help needed standing up from a chair using your arms (e.g., wheelchair or bedside chair)?: A Little Help needed to walk in hospital room?: A Little Help needed climbing 3-5 steps with a railing? : A Little 6 Click Score: 18    End of Session Equipment Utilized During Treatment: Gait belt Activity Tolerance: Patient tolerated treatment well;Patient limited by pain Patient left: in chair;with call bell/phone within reach;with chair alarm set;with family/visitor present;Other (comment) (w/ MD) Nurse Communication: Mobility status PT Visit Diagnosis: Unsteadiness on feet (R26.81);Difficulty in walking, not elsewhere classified (R26.2);Muscle weakness (generalized) (M62.81)     Time: 1007-1219 PT Time Calculation (min) (ACUTE ONLY): 23 min  Charges:                       Turner Daniels, SPT  07/24/2021, 12:05 PM

## 2021-07-24 NOTE — TOC Progression Note (Signed)
Transition of Care [facility]) - Progression Note    Patient Details  Name: Sally Wilson MRN: [id number] Date of Birth: 1950/07/14  Transition of Care [facility]) CM/SW [place], RN Phone Number: 07/24/2021, 12:22 PM  Clinical Narrative:    Patient going to room 507 at [facility], Her friend will transport her, She understands that Insurance may not cover EMS transport   Expected Discharge Plan: [place] Barriers to Discharge: Continued Medical Work up  Expected Discharge Plan and Services Expected Discharge Plan: [place] arrangements for the past 2 months: Single Family Home Expected Discharge Date: 07/24/21                                     Social Determinants of Health (SDOH) Interventions    Readmission Risk Interventions    07/22/2021    1:07 PM  Readmission Risk Prevention Plan  Post Dischage Appt Complete  Medication Screening Complete  Transportation Screening Complete

## 2021-07-24 NOTE — TOC Progression Note (Signed)
Transition of Care [facility]) - Progression Note    Patient Details  Name: Sally Wilson MRN: [id number] Date of Birth: April 21, 1950  Transition of Care [facility]) CM/SW [place], RN Phone Number: 07/24/2021, 8:51 AM  Clinical Narrative:    Ins Josem Kaufmann started to go to [employer], Ref number [id number] [facility] today if gets auth   Expected Discharge Plan: [place] Barriers to Discharge: Continued Medical Work up  Expected Discharge Plan and Services Expected Discharge Plan: [place] arrangements for the past 2 months: Single Family Home                                       Social Determinants of Health (SDOH) Interventions    Readmission Risk Interventions    07/22/2021    1:07 PM  Readmission Risk Prevention Plan  Post Dischage Appt Complete  Medication Screening Complete  Transportation Screening Complete

## 2021-07-24 NOTE — Care Management Important Message (Signed)
Important Message  Patient Details  Name: Sally Wilson MRN: [id number] Date of Birth: 1950/06/30   Medicare Important Message Given:  Yes     Juliann Pulse A Wendel Homeyer 07/24/2021, 12:49 PM

## 2021-08-09 ENCOUNTER — Encounter: Payer: Self-pay | Admitting: Gastroenterology

## 2021-08-09 ENCOUNTER — Ambulatory Visit (INDEPENDENT_AMBULATORY_CARE_PROVIDER_SITE_OTHER): Payer: Medicare Other | Admitting: Gastroenterology

## 2021-08-09 VITALS — BP 115/78 | HR 99 | Temp 97.1°F | Wt 129.0 lb

## 2021-08-09 DIAGNOSIS — K920 Hematemesis: Secondary | ICD-10-CM

## 2021-08-09 NOTE — Progress Notes (Signed)
Primary Care Physician: Idelle Crouch, MD  Primary Gastroenterologist:  Dr. Lucilla Lame  Chief Complaint  Patient presents with   Hospitalization Follow-up    HPI: Sally Wilson is a 71 y.o. female here who was recently hospital with hip pain.  The patient had significant hip pain and had some nausea and vomiting with hematemesis.  The patient has a history of irritable bowel syndrome and has seen me in the past for that.  The patient had a colonoscopy with multiple polyps back in 2022 and was recommended to have a repeat colonoscopy in 3 years.  The patient's labs while in the hospital showed:  Component     Latest Ref Rng 07/19/2021 07/20/2021 07/21/2021 07/22/2021  Hemoglobin     12.0 - 15.0 g/dL 12.8  10.3 (L)  9.3 (L)  9.5 (L)   HCT     36.0 - 46.0 % 37.8  30.9 (L)  28.0 (L)  28.2 (L)    Upon discharge the patient was recommended to follow-up with GI. The patient reports that she was very constipated and was taking laxatives in the hospital until she finally had results of hard stools.  She states it was combination of the narcotics that she was taking and since then she has developed a protrusion on her right lower abdomen that goes in and out.  It is not tender and she denies any fevers chills nausea vomiting.  The patient has lost 5 pounds.  Past Medical History:  Diagnosis Date   Arthritis    Current tobacco use    Dysplastic nevus 03/08/2021   left upper back paraspinal 2.5cm lat to spine- Excised 04/17/21   IBS (irritable bowel syndrome)    Kidney stone     Current Outpatient Medications  Medication Sig Dispense Refill   acetaminophen (TYLENOL) 325 MG tablet Take 650 mg by mouth every 6 (six) hours as needed for mild pain.     clonazePAM (KLONOPIN) 1 MG tablet Take 1 tablet (1 mg total) by mouth daily. 5 tablet 0   ferrous sulfate 325 (65 FE) MG tablet Take 1 tablet (325 mg total) by mouth every other day.  3   mometasone (ELOCON) 0.1 % cream Apply 1 application  topically daily as needed (Rash). Qd to aa right ear prn flares 15 g 0   mupirocin ointment (BACTROBAN) 2 % Apply 1 application. topically daily. Qd to excision site 22 g 1   nicotine (NICODERM CQ - DOSED IN MG/24 HOURS) 14 mg/24hr patch Place 1 patch (14 mg total) onto the skin daily. 28 patch 0   ondansetron (ZOFRAN-ODT) 4 MG disintegrating tablet Take 1 tablet (4 mg total) by mouth every 8 (eight) hours as needed for nausea or vomiting. 20 tablet 0   oxyCODONE-acetaminophen (PERCOCET/ROXICET) 5-325 MG tablet Take by mouth every 4 (four) hours as needed for severe pain.     PAXIL CR 25 MG 24 hr tablet Take 25 mg by mouth daily.   0   polyethylene glycol (MIRALAX / GLYCOLAX) 17 g packet Take 17 g by mouth daily as needed for mild constipation. 14 each 0   traZODone (DESYREL) 100 MG tablet Take 100 mg by mouth at bedtime.   0   vitamin B-12 (CYANOCOBALAMIN) 1000 MCG tablet Take 1 tablet (1,000 mcg total) by mouth daily.     No current facility-administered medications for this visit.    Allergies as of 08/09/2021 - Review Complete 08/09/2021  Allergen Reaction Noted  Cephalexin Swelling 08/21/2016   Codeine  03/06/2015   Penicillin g  11/20/2016    ROS:  General: Negative for anorexia, weight loss, fever, chills, fatigue, weakness. ENT: Negative for hoarseness, difficulty swallowing , nasal congestion. CV: Negative for chest pain, angina, palpitations, dyspnea on exertion, peripheral edema.  Respiratory: Negative for dyspnea at rest, dyspnea on exertion, cough, sputum, wheezing.  GI: See history of present illness. GU:  Negative for dysuria, hematuria, urinary incontinence, urinary frequency, nocturnal urination.  Endo: Negative for unusual weight change.    Physical Examination:   BP 115/78   Pulse 99   Temp (!) 97.1 F (36.2 C) (Oral)   Wt 129 lb (58.5 kg)   BMI 20.51 kg/m   General: Well-nourished, well-developed in no acute distress.  Eyes: No icterus. Conjunctivae  pink. Lungs: Clear to auscultation bilaterally. Non-labored. Heart: Regular rate and rhythm, no murmurs rubs or gallops.  Abdomen: Bowel sounds are normal, nontender, nondistended, no hepatosplenomegaly or masses, no abdominal bruits positive abdominal wall hernia , no rebound or guarding.   Extremities: No lower extremity edema. No clubbing or deformities. Neuro: Alert and oriented x 3.  Grossly intact. Skin: Warm and dry, no jaundice.   Psych: Alert and cooperative, normal mood and affect.  Labs:    Imaging Studies: DG HIP UNILAT WITH PELVIS 1V RIGHT  Result Date: 07/20/2021 CLINICAL DATA:  Rt hip and low back pain. No known injury.  [id number] EXAM: DG HIP (WITH OR WITHOUT PELVIS) 1V RIGHT COMPARISON:  None Available. FINDINGS: There is no evidence of hip fracture or dislocation of the right hip. No acute displaced fracture or dislocation of the left hip on frontal view. No acute displaced fracture or diastasis of the bones of the pelvis. There is no evidence of arthropathy or other focal bone abnormality. IMPRESSION: Negative. Electronically Signed   By: Iven Finn M.D.   On: 07/20/2021 20:27   DG Lumbar Spine Complete  Result Date: 07/20/2021 CLINICAL DATA:  Right hip and low back pain.  No known injury. EXAM: LUMBAR SPINE - COMPLETE 4+ VIEW COMPARISON:  None Available. FINDINGS: Mild scoliosis. Diffuse degenerative disc disease and facet disease throughout the lumbar spine. No fracture or subluxation. SI joints symmetric and unremarkable. IMPRESSION: Scoliosis and degenerative disc/facet disease. No acute bony abnormality. Electronically Signed   By: Rolm Baptise M.D.   On: 07/20/2021 20:27   MR LUMBAR SPINE WO CONTRAST  Result Date: 07/19/2021 CLINICAL DATA:  Low back pain, infection suspected, positive xray/CT EXAM: MRI LUMBAR SPINE WITHOUT CONTRAST TECHNIQUE: Multiplanar, multisequence MR imaging of the lumbar spine was performed. No intravenous contrast was administered. COMPARISON:   None Available. FINDINGS: Segmentation:  Standard. Alignment: There is mild retrolisthesis at L2-L3. Grade 1 anterolisthesis at L4-L5. Vertebrae: There are multilevel degenerative endplate changes most prominent at L1-L2 and L2-L3. There is no evidence of acute fracture, discitis, or aggressive osseous lesion. Conus medullaris and cauda equina: Conus extends to the L1-L2 level. Conus and cauda equina appear normal. Paraspinal and other soft tissues: Tiny left renal cyst which requires no follow-up. Lower paraspinal muscle atrophy. Disc levels: T12-L1: Minimal disc bulging. Ligamentum flavum hypertrophy and bilateral facet arthropathy. There is minimal spinal canal narrowing. Mild right neural foraminal stenosis. L1-L2: Minimal disc bulging. Ligamentum flavum hypertrophy and mild facet arthropathy. Minimal spinal canal narrowing. Mild left neural foraminal stenosis. L2-L3: Mild retrolisthesis. Ligament flavum hypertrophy and bilateral facet arthropathy with bilateral facet effusions. Minimal disc bulging and endplate spurring. There is left greater than right  subarticular stenosis and contact with the descending left L3 nerve root. There is moderate left neural foraminal stenosis. Mild right neural foraminal stenosis. L3-L4: Minimal asymmetric left disc bulging. Ligamentum flavum hypertrophy and bilateral facet arthropathy. There is mild right subarticular stenosis and contact with the descending right L4 nerve root. There is mild bilateral neural foraminal stenosis. L4-L5: Grade 1 anterolisthesis with minimal disc bulging. Ligamentum flavum hypertrophy and bilateral facet arthropathy with right greater left facet effusions. There is mild spinal canal stenosis with moderate bilateral subarticular stenosis and contact with the descending L5 nerve roots bilaterally. L5-S1: Mild disc bulging. Ligament flavum hypertrophy and bilateral facet arthropathy. No spinal canal stenosis. There is mild-to-moderate bilateral neural  foraminal stenosis. IMPRESSION: Multilevel degenerative changes of the lumbar spine, summarized below: T12-L1: Minimal spinal canal narrowing. Mild right neural foraminal stenosis. L2: Minimal spinal canal narrowing. Mild left neural foraminal stenosis. L2-L3: Mild retrolisthesis. Left greater than right subarticular stenosis and contact with the descending left L3 nerve root. Moderate left and mild right neural foraminal stenosis. L3-L4: Right subarticular stenosis and contact with the descending right L4 nerve root. Mild bilateral neural foraminal stenosis. L4-L5: Grade 1 anterolisthesis. Mild spinal canal stenosis. Moderate bilateral subarticular stenosis and contact with the descending L5 nerve roots bilaterally. L5-S1: Mild-to-moderate bilateral neural foraminal stenosis. No spinal canal stenosis. Electronically Signed   By: Maurine Simmering M.D.   On: 07/19/2021 21:00   MR HIP RIGHT WO CONTRAST  Result Date: 07/19/2021 CLINICAL DATA:  Fracture, hip EXAM: MR OF THE RIGHT HIP WITHOUT CONTRAST TECHNIQUE: Multiplanar, multisequence MR imaging was performed. No intravenous contrast was administered. COMPARISON:  None Available. FINDINGS: Bones: There is no evidence of acute fracture, dislocation or avascular necrosis. No focal bone lesion. Mild degenerative changes of the SI joints. The pubic symphysis is unremarkable. Lower lumbar spine degenerative disc disease, partially visualized. 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 Articular cartilage and labrum Articular cartilage:  Moderate chondrosis. Labrum: There is degenerative superior labral tearing anteriorly and posteriorly. Joint or bursal effusion Joint effusion: No significant hip joint effusion. Bursae: No evidence of trochanteric bursitis. Muscles and tendons Muscles and tendons: There is tendinosis and probable chronic insertional partial tearing of the distal gluteus minimus tendon at the greater trochanter. There is mild  peritrochanteric edema. Similar findings along the left hip. There is tendinosis of the proximal hamstrings bilaterally.There is edema within the quadratus femoris muscles bilaterally with narrowed ischiofemoral space. There is some reactive muscle edema in the hip adductors bilaterally and in the right gluteus maximus. There is a benign tiny enchondroma in the left proximal femur. Other findings Miscellaneous: Colonic diverticulosis. IMPRESSION: No evidence of acute hip fracture. Moderate right hip osteoarthritis with degenerative superior labral tearing anteriorly and posteriorly. 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 Mild peritrochanteric edema without significant bursitis. Mild findings of ischiofemoral impingement bilaterally. Electronically Signed   By: Maurine Simmering M.D.   On: 07/19/2021 20:50   CT ABDOMEN PELVIS W CONTRAST  Result Date: 07/19/2021 CLINICAL DATA:  Flank pain EXAM: CT ABDOMEN AND PELVIS WITH CONTRAST TECHNIQUE: Multidetector CT imaging of the abdomen and pelvis was performed using the standard protocol following bolus administration of intravenous contrast. RADIATION DOSE REDUCTION: This exam was performed according to the departmental dose-optimization program which includes automated exposure control, adjustment of the mA and/or kV according to patient size and/or use of iterative reconstruction technique. CONTRAST:  152m OMNIPAQUE IOHEXOL 300 MG/ML  SOLN COMPARISON:  07/18/2021  FINDINGS: Lower chest: No acute abnormality. Hepatobiliary: No solid liver abnormality is seen. No gallstones, gallbladder wall thickening, or biliary dilatation. Pancreas: Unremarkable. No pancreatic ductal dilatation or surrounding inflammatory changes. Spleen: Normal in size without significant abnormality. Adrenals/Urinary Tract: Adrenal glands are unremarkable. Small nonobstructive calculus of the inferior pole of the right kidney. No left-sided calculi, ureteral calculi, or  hydronephrosis. Bladder is unremarkable. Stomach/Bowel: Stomach is within normal limits. Appendix appears normal. No evidence of bowel wall thickening, distention, or inflammatory changes. Descending and sigmoid diverticulosis. Moderate burden of stool throughout the colon and rectum. Vascular/Lymphatic: Aortic atherosclerosis. No enlarged abdominal or pelvic lymph nodes. Reproductive: Small calcified uterine fibroids. Other: No abdominal wall hernia or abnormality. No ascites. Musculoskeletal: No acute or significant osseous findings. IMPRESSION: 1. No acute CT findings of the abdomen or pelvis to explain pain. 2. Small nonobstructive calculus of the inferior pole of the right kidney. No left-sided calculi, ureteral calculi, or hydronephrosis. 3. Descending and sigmoid diverticulosis without evidence of acute diverticulitis. 4. Moderate burden of stool throughout the colon and rectum. Aortic Atherosclerosis (ICD10-I70.0). Electronically Signed   By: Delanna Ahmadi M.D.   On: 07/19/2021 16:24   CT Renal Stone Study  Result Date: 07/18/2021 CLINICAL DATA:  Right flank and back pain x1 we EXAM: CT ABDOMEN AND PELVIS WITHOUT CONTRAST TECHNIQUE: Multidetector CT imaging of the abdomen and pelvis was performed following the standard protocol without IV contrast. RADIATION DOSE REDUCTION: This exam was performed according to the departmental dose-optimization program which includes automated exposure control, adjustment of the mA and/or kV according to patient size and/or use of iterative reconstruction technique. COMPARISON:  None Available. FINDINGS: Lower chest: No pleural or pericardial effusion. Linear scarring or atelectasis at the left lung base. Hepatobiliary: No focal liver abnormality is seen. No gallstones, gallbladder wall thickening, or biliary dilatation. Pancreas: Unremarkable. No pancreatic ductal dilatation or surrounding inflammatory changes. Spleen: Normal in size without focal abnormality.  Adrenals/Urinary Tract: No adrenal mass. Symmetric renal contours. 6 mm right lower pole renal calculus. No hydronephrosis. No definite ureteral calculus. Urinary bladder physiologically distended. Stomach/Bowel: Stomach is nondistended. The small bowel is nondilated. Normal appendix. The colon is nondilated. A few descending segment diverticula without adjacent inflammatory change. Vascular/Lymphatic: Mild scattered aortoiliac calcified atheromatous plaque without aneurysm. No abdominal or pelvic adenopathy. Reproductive: Coarse uterine calcifications probably degenerated fibroids. Bilateral coarse adnexal calcifications. No adnexal mass. Other: No ascites.  No free air. Musculoskeletal: Multilevel lower thoracic and lumbar spondylitic change. No fracture or worrisome bone lesion. IMPRESSION: 1. No acute findings. 2. Nonobstructive right urolithiasis. 3. Descending colon diverticulosis. 4.  Aortic Atherosclerosis (ICD10-170.0). Electronically Signed   By: Lucrezia Europe M.D.   On: 07/18/2021 13:33    Assessment and Plan:   Sally Wilson is a 71 y.o. y/o female who comes in today after having hematemesis that she states was due to her pain and narcotic use without any further signs of any black stools bloody stools abdominal pain or dysphagia.  The patient has been explained the findings of the abdominal wall hernia and the low risk of that causing any problems.  The patient has been told that if she continues to lose weight or has any further upper GI symptoms that she should consider having an upper endoscopy.  The patient has been been explained the plan and will contact me if her symptoms return.      Lucilla Lame, MD. Marval Regal    Note: This dictation was prepared with Dragon dictation along with  smaller phrase technology. Any transcriptional errors that result from this process are unintentional.

## 2021-11-26 ENCOUNTER — Telehealth: Payer: Self-pay | Admitting: Gastroenterology

## 2021-11-26 NOTE — Telephone Encounter (Signed)
Patient called again stating she has severe constipation and the last time this happened she didn't have a bowel movement for 21 days and ended up in the hospital. I scheduled the patient for October 25th at 10:15 which was an opening on the schedule.

## 2021-11-26 NOTE — Telephone Encounter (Signed)
Left detailed msg on VM per HIPAA Per VO Dr Allen Norris recommended starting Miralax, milk of magnesia and/or magnesium citrate

## 2021-11-26 NOTE — Telephone Encounter (Signed)
Pt is calling with severe constipation would like a call back

## 2021-12-05 ENCOUNTER — Ambulatory Visit: Payer: Medicare Other | Admitting: Gastroenterology

## 2021-12-05 VITALS — BP 102/68 | HR 96 | Temp 98.1°F | Wt 137.0 lb

## 2021-12-05 DIAGNOSIS — K5900 Constipation, unspecified: Secondary | ICD-10-CM | POA: Diagnosis not present

## 2021-12-05 NOTE — Progress Notes (Signed)
Primary Care Physician: Idelle Crouch, MD  Primary Gastroenterologist:  Dr. Lucilla Lame  Chief Complaint  Patient presents with   Constipation    Pt reports increased bloating with Miralax, pt tried Milk of Magnesia with minimal relief with small BM    HPI: Sally Wilson is a 71 y.o. female here with constipation.  The patient states that she was put on pain medication and her constipation has gotten worse but after stopping the medication her bowel movements are still infrequent.  The patient had a colonoscopy back in November 2022 without any obstruction seen.  Past Medical History:  Diagnosis Date   Arthritis    Current tobacco use    Dysplastic nevus 03/08/2021   left upper back paraspinal 2.5cm lat to spine- Excised 04/17/21   IBS (irritable bowel syndrome)    Kidney stone     Current Outpatient Medications  Medication Sig Dispense Refill   acetaminophen (TYLENOL) 325 MG tablet Take 650 mg by mouth every 6 (six) hours as needed for mild pain.     clonazePAM (KLONOPIN) 1 MG tablet Take 1 tablet (1 mg total) by mouth daily. 5 tablet 0   ferrous sulfate 325 (65 FE) MG tablet Take 1 tablet (325 mg total) by mouth every other day.  3   mometasone (ELOCON) 0.1 % cream Apply 1 application topically daily as needed (Rash). Qd to aa right ear prn flares 15 g 0   mupirocin ointment (BACTROBAN) 2 % Apply 1 application. topically daily. Qd to excision site 22 g 1   nicotine (NICODERM CQ - DOSED IN MG/24 HOURS) 14 mg/24hr patch Place 1 patch (14 mg total) onto the skin daily. 28 patch 0   ondansetron (ZOFRAN-ODT) 4 MG disintegrating tablet Take 1 tablet (4 mg total) by mouth every 8 (eight) hours as needed for nausea or vomiting. 20 tablet 0   oxyCODONE-acetaminophen (PERCOCET/ROXICET) 5-325 MG tablet Take by mouth every 4 (four) hours as needed for severe pain.     PAXIL CR 25 MG 24 hr tablet Take 25 mg by mouth daily.   0   polyethylene glycol (MIRALAX / GLYCOLAX) 17 g packet  Take 17 g by mouth daily as needed for mild constipation. 14 each 0   traZODone (DESYREL) 100 MG tablet Take 100 mg by mouth at bedtime.   0   vitamin B-12 (CYANOCOBALAMIN) 1000 MCG tablet Take 1 tablet (1,000 mcg total) by mouth daily.     No current facility-administered medications for this visit.    Allergies as of 12/05/2021 - Review Complete 08/09/2021  Allergen Reaction Noted   Cephalexin Swelling 08/21/2016   Codeine  03/06/2015   Penicillin g  11/20/2016    ROS:  General: Negative for anorexia, weight loss, fever, chills, fatigue, weakness. ENT: Negative for hoarseness, difficulty swallowing , nasal congestion. CV: Negative for chest pain, angina, palpitations, dyspnea on exertion, peripheral edema.  Respiratory: Negative for dyspnea at rest, dyspnea on exertion, cough, sputum, wheezing.  GI: See history of present illness. GU:  Negative for dysuria, hematuria, urinary incontinence, urinary frequency, nocturnal urination.  Endo: Negative for unusual weight change.    Physical Examination:   BP 102/68   Pulse 96   Temp 98.1 F (36.7 C) (Oral)   Wt 137 lb (62.1 kg)   BMI 21.78 kg/m   General: Well-nourished, well-developed in no acute distress.  Eyes: No icterus. Conjunctivae pink. Neuro: Alert and oriented x 3.  Grossly intact. Skin: Warm and dry, no  jaundice.   Psych: Alert and cooperative, normal mood and affect.  Labs:    Imaging Studies: No results found.  Assessment and Plan:   Sally Wilson is a 71 y.o. y/o female Who comes in today with constipation.  The patient will be started on a trial of Linzess.  The patient has been given samples of 290 g and 145 g.  The patient has been told to try the 290 g and if that causes her diarrhea then she should decrease to the 145.  The patient will let me know which dose works for her and then we will call in a prescription for that test.  The patient has been explained the plan and agrees with  it.     Lucilla Lame, MD. Marval Regal    Note: This dictation was prepared with Dragon dictation along with smaller phrase technology. Any transcriptional errors that result from this process are unintentional.

## 2021-12-05 NOTE — Patient Instructions (Signed)
Linzess 249mg and 1461m samples given  Start Linzess 29031m30 minutes before breakfast.  If you start to have diarrhea, change to Linzess 145m75m

## 2022-03-14 ENCOUNTER — Ambulatory Visit: Payer: Medicare Other | Admitting: Dermatology

## 2022-03-14 VITALS — BP 112/72 | HR 75

## 2022-03-14 DIAGNOSIS — L2089 Other atopic dermatitis: Secondary | ICD-10-CM

## 2022-03-14 DIAGNOSIS — Z1283 Encounter for screening for malignant neoplasm of skin: Secondary | ICD-10-CM

## 2022-03-14 DIAGNOSIS — Z79899 Other long term (current) drug therapy: Secondary | ICD-10-CM

## 2022-03-14 DIAGNOSIS — Z86018 Personal history of other benign neoplasm: Secondary | ICD-10-CM

## 2022-03-14 DIAGNOSIS — L578 Other skin changes due to chronic exposure to nonionizing radiation: Secondary | ICD-10-CM

## 2022-03-14 DIAGNOSIS — L820 Inflamed seborrheic keratosis: Secondary | ICD-10-CM

## 2022-03-14 DIAGNOSIS — D229 Melanocytic nevi, unspecified: Secondary | ICD-10-CM

## 2022-03-14 DIAGNOSIS — L814 Other melanin hyperpigmentation: Secondary | ICD-10-CM

## 2022-03-14 DIAGNOSIS — L821 Other seborrheic keratosis: Secondary | ICD-10-CM

## 2022-03-14 DIAGNOSIS — H61001 Unspecified perichondritis of right external ear: Secondary | ICD-10-CM | POA: Diagnosis not present

## 2022-03-14 DIAGNOSIS — L309 Dermatitis, unspecified: Secondary | ICD-10-CM

## 2022-03-14 DIAGNOSIS — D1801 Hemangioma of skin and subcutaneous tissue: Secondary | ICD-10-CM

## 2022-03-14 MED ORDER — MOMETASONE FUROATE 0.1 % EX CREA: TOPICAL_CREAM | CUTANEOUS | 1 refills | Status: AC

## 2022-03-14 NOTE — Patient Instructions (Addendum)
Seborrheic Keratosis  What causes seborrheic keratoses? Seborrheic keratoses are harmless, common skin growths that first appear during adult life.  As time goes by, more growths appear.  Some people may develop a large number of them.  Seborrheic keratoses appear on both covered and uncovered body parts.  They are not caused by sunlight.  The tendency to develop seborrheic keratoses can be inherited.  They vary in color from skin-colored to gray, brown, or even black.  They can be either smooth or have a rough, warty surface.   Seborrheic keratoses are superficial and look as if they were stuck on the skin.  Under the microscope this type of keratosis looks like layers upon layers of skin.  That is why at times the top layer may seem to fall off, but the rest of the growth remains and re-grows.    Treatment Seborrheic keratoses do not need to be treated, but can easily be removed in the office.  Seborrheic keratoses often cause symptoms when they rub on clothing or jewelry.  Lesions can be in the way of shaving.  If they become inflamed, they can cause itching, soreness, or burning.  Removal of a seborrheic keratosis can be accomplished by freezing, burning, or surgery. If any spot bleeds, scabs, or grows rapidly, please return to have it checked, as these can be an indication of a skin cancer.  Cryotherapy Aftercare  Wash gently with soap and water everyday.   Apply Vaseline and Band-Aid daily until healed.       Melanoma ABCDEs  Melanoma is the most dangerous type of skin cancer, and is the leading cause of death from skin disease.  You are more likely to develop melanoma if you: Have light-colored skin, light-colored eyes, or red or blond hair Spend a lot of time in the sun Tan regularly, either outdoors or in a tanning bed Have had blistering sunburns, especially during childhood Have a close family member who has had a melanoma Have atypical moles or large birthmarks  Early  detection of melanoma is key since treatment is typically straightforward and cure rates are extremely high if we catch it early.   The first sign of melanoma is often a change in a mole or a new dark spot.  The ABCDE system is a way of remembering the signs of melanoma.  A for asymmetry:  The two halves do not match. B for border:  The edges of the growth are irregular. C for color:  A mixture of colors are present instead of an even brown color. D for diameter:  Melanomas are usually (but not always) greater than 6mm - the size of a pencil eraser. E for evolution:  The spot keeps changing in size, shape, and color.  Please check your skin once per month between visits. You can use a small mirror in front and a large mirror behind you to keep an eye on the back side or your body.   If you see any new or changing lesions before your next follow-up, please call to schedule a visit.  Please continue daily skin protection including broad spectrum sunscreen SPF 30+ to sun-exposed areas, reapplying every 2 hours as needed when you're outdoors.   Staying in the shade or wearing long sleeves, sun glasses (UVA+UVB protection) and wide brim hats (4-inch brim around the entire circumference of the hat) are also recommended for sun protection.     Due to recent changes in healthcare laws, you may see results of   your pathology and/or laboratory studies on MyChart before the doctors have had a chance to review them. We understand that in some cases there may be results that are confusing or concerning to you. Please understand that not all results are received at the same time and often the doctors may need to interpret multiple results in order to provide you with the best plan of care or course of treatment. Therefore, we ask that you please give us 2 business days to thoroughly review all your results before contacting the office for clarification. Should we see a critical lab result, you will be contacted  sooner.   If You Need Anything After Your Visit  If you have any questions or concerns for your doctor, please call our main line at [phone] and press option 4 to reach your doctor's medical assistant. If no one answers, please leave a voicemail as directed and we will return your call as soon as possible. Messages left after 4 pm will be answered the following business day.   You may also send us a message via MyChart. We typically respond to MyChart messages within 1-2 business days.  For prescription refills, please ask your pharmacy to contact our office. Our fax number is [phone].  If you have an urgent issue when the clinic is closed that cannot wait until the next business day, you can page your doctor at the number below.    Please note that while we do our best to be available for urgent issues outside of office hours, we are not available 24/7.   If you have an urgent issue and are unable to reach us, you may choose to seek medical care at your doctor's office, retail clinic, urgent care center, or emergency room.  If you have a medical emergency, please immediately call 911 or go to the emergency department.  Pager Numbers  - Dr. Kowalski: [phone]  - Dr. Moye: [phone]  - Dr. Stewart: [phone]  In the event of inclement weather, please call our main line at [phone] for an update on the status of any delays or closures.  Dermatology Medication Tips: Please keep the boxes that topical medications come in in order to help keep track of the instructions about where and how to use these. Pharmacies typically print the medication instructions only on the boxes and not directly on the medication tubes.   If your medication is too expensive, please contact our office at [phone] option 4 or send us a message through MyChart.   We are unable to tell what your co-pay for medications will be in advance as this is different depending on your insurance  coverage. However, we may be able to find a substitute medication at lower cost or fill out paperwork to get insurance to cover a needed medication.   If a prior authorization is required to get your medication covered by your insurance company, please allow us 1-2 business days to complete this process.  Drug prices often vary depending on where the prescription is filled and some pharmacies may offer cheaper prices.  The website www.goodrx.com contains coupons for medications through different pharmacies. The prices here do not account for what the cost may be with help from insurance (it may be cheaper with your insurance), but the website can give you the price if you did not use any insurance.  - You can print the associated coupon and take it with your prescription to the pharmacy.  - You may also stop   by our office during regular business hours and pick up a GoodRx coupon card.  - If you need your prescription sent electronically to a different pharmacy, notify our office through [facility] MyChart or by phone at [phone] option 4.     Si Usted Necesita Algo Despus de Su Visita  Tambin puede enviarnos un mensaje a travs de MyChart. Por lo general respondemos a los mensajes de MyChart en el transcurso de 1 a 2 das hbiles.  Para renovar recetas, por favor pida a su farmacia que se ponga en contacto con nuestra oficina. Nuestro nmero de fax es el [phone].  Si tiene un asunto urgente cuando la clnica est cerrada y que no puede esperar hasta el siguiente da hbil, puede llamar/localizar a su doctor(a) al nmero que aparece a continuacin.   Por favor, tenga en cuenta que aunque hacemos todo lo posible para estar disponibles para asuntos urgentes fuera del horario de oficina, no estamos disponibles las 24 horas del da, los 7 das de la semana.   Si tiene un problema urgente y no puede comunicarse con nosotros, puede optar por buscar atencin mdica  en el consultorio de  su doctor(a), en una clnica privada, en un centro de atencin urgente o en una sala de emergencias.  Si tiene una emergencia mdica, por favor llame inmediatamente al 911 o vaya a la sala de emergencias.  Nmeros de bper  - Dr. Kowalski: [phone]  - Dra. Moye: [phone]  - Dra. Stewart: [phone]  En caso de inclemencias del tiempo, por favor llame a nuestra lnea principal al [phone] para una actualizacin sobre el estado de cualquier retraso o cierre.  Consejos para la medicacin en dermatologa: Por favor, guarde las cajas en las que vienen los medicamentos de uso tpico para ayudarle a seguir las instrucciones sobre dnde y cmo usarlos. Las farmacias generalmente imprimen las instrucciones del medicamento slo en las cajas y no directamente en los tubos del medicamento.   Si su medicamento es muy caro, por favor, pngase en contacto con nuestra oficina llamando al [phone] y presione la opcin 4 o envenos un mensaje a travs de MyChart.   No podemos decirle cul ser su copago por los medicamentos por adelantado ya que esto es diferente dependiendo de la cobertura de su seguro. Sin embargo, es posible que podamos encontrar un medicamento sustituto a menor costo o llenar un formulario para que el seguro cubra el medicamento que se considera necesario.   Si se requiere una autorizacin previa para que su compaa de seguros cubra su medicamento, por favor permtanos de 1 a 2 das hbiles para completar este proceso.  Los precios de los medicamentos varan con frecuencia dependiendo del lugar de dnde se surte la receta y alguna farmacias pueden ofrecer precios ms baratos.  El sitio web www.goodrx.com tiene cupones para medicamentos de diferentes farmacias. Los precios aqu no tienen en cuenta lo que podra costar con la ayuda del seguro (puede ser ms barato con su seguro), pero el sitio web puede darle el precio si no utiliz ningn seguro.  - Puede imprimir el  cupn correspondiente y llevarlo con su receta a la farmacia.  - Tambin puede pasar por nuestra oficina durante el horario de atencin regular y recoger una tarjeta de cupones de GoodRx.  - Si necesita que su receta se enve electrnicamente a una farmacia diferente, informe a nuestra oficina a travs de MyChart de [facility] o por telfono llamando al [phone] y presione la opcin 4.  

## 2022-03-14 NOTE — Progress Notes (Signed)
Follow-Up Visit   Subjective  Sally Wilson is a 73 y.o. female who presents for the following: Annual Exam (1 year tbse, history of dysplastic nevi, hx of chondrodermattis nodularis helicis of right ear. Patient reports some itchy areas at right cheek area.). The patient presents for Total-Body Skin Exam (TBSE) for skin cancer screening and mole check.  The patient has spots, moles and lesions to be evaluated, some may be new or changing and the patient has concerns that these could be cancer.  The following portions of the chart were reviewed this encounter and updated as appropriate:  Tobacco  Allergies  Meds  Problems  Med Hx  Surg Hx  Fam Hx     Review of Systems: No other skin or systemic complaints except as noted in HPI or Assessment and Plan.  Objective  Well appearing patient in no apparent distress; mood and affect are within normal limits.  A full examination was performed including scalp, head, eyes, ears, nose, lips, neck, chest, axillae, abdomen, back, buttocks, bilateral upper extremities, bilateral lower extremities, hands, feet, fingers, toes, fingernails, and toenails. All findings within normal limits unless otherwise noted below.  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  Trauma or pressure from sleeping on the ear or from cell phone use and sun damage may be exacerbating factors.  Treatment may include using a C-shaped airplane neck pillow for sleeping on the side of the head so no pressure is on the ear.  Other treatments include topical or intralesional steroids; liquid nitrogen or laser destruction; shave removal or excision.  The condition can be difficult  to treat and persist or recur despite treatment.  Continue  Mometasone cr qhs aa prn flares   Related Medications mometasone (ELOCON) 0.1 % cream Apply 1 application topically daily as needed (Rash). Qd to aa right ear prn flares  Inflamed seborrheic keratosis Right Preauricular Area x 1 Symptomatic, irritating, patient would like treated. Destruction of lesion - Right Preauricular Area x 1 Complexity: simple   Destruction method: cryotherapy   Informed consent: discussed and consent obtained   Timeout:  patient name, date of birth, surgical site, and procedure verified Lesion destroyed using liquid nitrogen: Yes   Region frozen until ice ball extended beyond lesion: Yes   Outcome: patient tolerated procedure well with no complications   Post-procedure details: wound care instructions given   Additional details:  Prior to procedure, discussed risks of blister formation, small wound, skin dyspigmentation, or rare scar following cryotherapy. Recommend Vaseline ointment to treated areas while healing.  Eczema, Right Lower Leg - Anterior Atopic dermatitis (eczema) is a chronic, relapsing, pruritic condition that can significantly affect quality of life. It is often associated with allergic rhinitis and/or asthma and can require treatment with topical medications, phototherapy, or in severe cases biologic injectable medication (Dupixent; Adbry) or Oral JAK inhibitors.  Start mometasone cream apply qd./bid prn for itchy areas on legs  mometasone (ELOCON) 0.1 % cream - Right Lower Leg - Anterior Apply topically qd/bid 5 days weekly prn for itchy skin at legs for eczema  Lentigines - Scattered tan macules - Due to sun exposure - Benign-appearing, observe - Recommend daily broad spectrum sunscreen SPF 30+ to sun-exposed areas, reapply every 2  hours as needed. - Call for any changes  Seborrheic Keratoses - Stuck-on, waxy, tan-brown papules and/or plaques  - Benign-appearing - Discussed  benign etiology and prognosis. - Observe - Call for any changes  Melanocytic Nevi - Tan-brown and/or pink-flesh-colored symmetric macules and papules - Benign appearing on exam today - Observation - Call clinic for new or changing moles - Recommend daily use of broad spectrum spf 30+ sunscreen to sun-exposed areas.   Hemangiomas - Red papules - Discussed benign nature - Observe - Call for any changes  Actinic Damage - Chronic condition, secondary to cumulative UV/sun exposure - diffuse scaly erythematous macules with underlying dyspigmentation - Recommend daily broad spectrum sunscreen SPF 30+ to sun-exposed areas, reapply every 2 hours as needed.  - Staying in the shade or wearing long sleeves, sun glasses (UVA+UVB protection) and wide brim hats (4-inch brim around the entire circumference of the hat) are also recommended for sun protection.  - Call for new or changing lesions.  History of Dysplastic Nevi Left upper back paraspinal 2.5 cm lat to spine  - No evidence of recurrence today - Recommend regular full body skin exams - Recommend daily broad spectrum sunscreen SPF 30+ to sun-exposed areas, reapply every 2 hours as needed.  - Call if any new or changing lesions are noted between office visits  Skin cancer screening performed today. Return in about 1 year (around 03/15/2023) for tbse.  IRuthell Rummage, CMA, am acting as scribe for Sarina Ser, MD. Documentation: I have reviewed the above documentation for accuracy and completeness, and I agree with the above.  Sarina Ser, MD

## 2022-03-19 ENCOUNTER — Encounter: Payer: Self-pay | Admitting: Dermatology

## 2022-09-22 ENCOUNTER — Encounter: Payer: Self-pay | Admitting: Emergency Medicine

## 2022-09-22 ENCOUNTER — Emergency Department: Payer: Medicare Other

## 2022-09-22 ENCOUNTER — Emergency Department
Admission: EM | Admit: 2022-09-22 | Discharge: 2022-09-22 | Disposition: A | Discharge status: Home / Self Care | Payer: Medicare Other | Attending: Emergency Medicine | Admitting: Emergency Medicine

## 2022-09-22 ENCOUNTER — Other Ambulatory Visit: Payer: Self-pay

## 2022-09-22 DIAGNOSIS — R55 Syncope and collapse: Secondary | ICD-10-CM | POA: Insufficient documentation

## 2022-09-22 DIAGNOSIS — R109 Unspecified abdominal pain: Secondary | ICD-10-CM | POA: Insufficient documentation

## 2022-09-22 DIAGNOSIS — U071 COVID-19: Secondary | ICD-10-CM | POA: Diagnosis not present

## 2022-09-22 DIAGNOSIS — R509 Fever, unspecified: Secondary | ICD-10-CM | POA: Diagnosis present

## 2022-09-22 LAB — URINALYSIS, W/ REFLEX TO CULTURE (INFECTION SUSPECTED)
Bilirubin Urine: NEGATIVE
Glucose, UA: NEGATIVE mg/dL
Ketones, ur: 5 mg/dL — AB
Leukocytes,Ua: NEGATIVE
Nitrite: NEGATIVE
Protein, ur: NEGATIVE mg/dL
Specific Gravity, Urine: 1.012 (ref 1.005–1.030)
pH: 6 (ref 5.0–8.0)

## 2022-09-22 LAB — COMPREHENSIVE METABOLIC PANEL
ALT: 17 U/L (ref 0–44)
AST: 20 U/L (ref 15–41)
Albumin: 3.8 g/dL (ref 3.5–5.0)
Alkaline Phosphatase: 63 U/L (ref 38–126)
Anion gap: 9 (ref 5–15)
BUN: 9 mg/dL (ref 8–23)
CO2: 21 mmol/L — ABNORMAL LOW (ref 22–32)
Calcium: 8.5 mg/dL — ABNORMAL LOW (ref 8.9–10.3)
Chloride: 103 mmol/L (ref 98–111)
Creatinine, Ser: 0.95 mg/dL (ref 0.44–1.00)
GFR, Estimated: >60 mL/min (ref >60)
Glucose, Bld: 133 mg/dL — ABNORMAL HIGH (ref 70–99)
Potassium: 3.4 mmol/L — ABNORMAL LOW (ref 3.5–5.1)
Sodium: 133 mmol/L — ABNORMAL LOW (ref 135–145)
Total Bilirubin: 0.3 mg/dL (ref 0.3–1.2)
Total Protein: 6.6 g/dL (ref 6.5–8.1)

## 2022-09-22 LAB — CBC WITH DIFFERENTIAL/PLATELET
Abs Immature Granulocytes: 0.02 10*3/uL (ref 0.00–0.07)
Basophils Absolute: 0 10*3/uL (ref 0.0–0.1)
Basophils Relative: 0 %
Eosinophils Absolute: 0 10*3/uL (ref 0.0–0.5)
Eosinophils Relative: 1 %
HCT: 41.8 % (ref 36.0–46.0)
Hemoglobin: 14 g/dL (ref 12.0–15.0)
Immature Granulocytes: 0 %
Lymphocytes Relative: 25 %
Lymphs Abs: 1.4 10*3/uL (ref 0.7–4.0)
MCH: 30.8 pg (ref 26.0–34.0)
MCHC: 33.5 g/dL (ref 30.0–36.0)
MCV: 92.1 fL (ref 80.0–100.0)
Monocytes Absolute: 0.8 10*3/uL (ref 0.1–1.0)
Monocytes Relative: 15 %
Neutro Abs: 3.2 10*3/uL (ref 1.7–7.7)
Neutrophils Relative %: 59 %
Platelets: 122 10*3/uL — ABNORMAL LOW (ref 150–400)
RBC: 4.54 MIL/uL (ref 3.87–5.11)
RDW: 13 % (ref 11.5–15.5)
WBC: 5.5 10*3/uL (ref 4.0–10.5)
nRBC: 0 % (ref 0.0–0.2)

## 2022-09-22 LAB — LACTIC ACID, PLASMA: Lactic Acid, Venous: 1.1 mmol/L (ref 0.5–1.9)

## 2022-09-22 LAB — RESP PANEL BY RT-PCR (RSV, FLU A&B, COVID)  RVPGX2
Influenza A by PCR: NEGATIVE
Influenza B by PCR: NEGATIVE
Resp Syncytial Virus by PCR: NEGATIVE
SARS Coronavirus 2 by RT PCR: POSITIVE — AB

## 2022-09-22 LAB — PROTIME-INR
INR: 1 (ref 0.8–1.2)
Prothrombin Time: 13.4 seconds (ref 11.4–15.2)

## 2022-09-22 LAB — APTT: aPTT: 32 seconds (ref 24–36)

## 2022-09-22 MED ORDER — ONDANSETRON 8 MG PO TBDP: 8.0000 mg | ORAL_TABLET | Freq: Three times a day (TID) | ORAL | 0 refills | Status: DC | PRN

## 2022-09-22 MED ORDER — SODIUM CHLORIDE 0.9 % IV BOLUS (SEPSIS)
1000.0000 mL | Freq: Once | INTRAVENOUS | Status: AC
  Administered 2022-09-22: 1000 mL via INTRAVENOUS

## 2022-09-22 MED ORDER — SODIUM CHLORIDE 0.9 % IV SOLN
1.0000 g | Freq: Once | INTRAVENOUS | Status: AC
  Administered 2022-09-22: 1 g via INTRAVENOUS
  Filled 2022-09-22: qty 20

## 2022-09-22 MED ORDER — KETOROLAC TROMETHAMINE 10 MG PO TABS: 10.0000 mg | ORAL_TABLET | Freq: Four times a day (QID) | ORAL | 0 refills | Status: DC | PRN

## 2022-09-22 MED ORDER — KETOROLAC TROMETHAMINE 30 MG/ML IJ SOLN
15.0000 mg | Freq: Once | INTRAMUSCULAR | Status: AC
  Administered 2022-09-22: 15 mg via INTRAVENOUS
  Filled 2022-09-22: qty 1

## 2022-09-22 MED ORDER — LEVOFLOXACIN IN D5W 750 MG/150ML IV SOLN
750.0000 mg | Freq: Once | INTRAVENOUS | Status: DC
  Filled 2022-09-22: qty 150

## 2022-09-22 MED ORDER — ONDANSETRON 8 MG PO TBDP: 8.0000 mg | ORAL_TABLET | Freq: Three times a day (TID) | ORAL | 0 refills | Status: AC | PRN

## 2022-09-22 MED ORDER — METRONIDAZOLE 500 MG/100ML IV SOLN
500.0000 mg | Freq: Once | INTRAVENOUS | Status: DC
  Administered 2022-09-22: 500 mg via INTRAVENOUS
  Filled 2022-09-22: qty 100

## 2022-09-22 MED ORDER — ONDANSETRON HCL 4 MG/2ML IJ SOLN
INTRAMUSCULAR | Status: AC
  Administered 2022-09-22: 4 mg via INTRAVENOUS
  Filled 2022-09-22: qty 2

## 2022-09-22 MED ORDER — ONDANSETRON HCL 4 MG/2ML IJ SOLN: 4.0000 mg | Freq: Once | INTRAMUSCULAR | Status: AC

## 2022-09-22 MED ORDER — LACTATED RINGERS IV SOLN: INTRAVENOUS | Status: DC

## 2022-09-22 NOTE — Progress Notes (Signed)
Elink following for sepsis protocol. 

## 2022-09-22 NOTE — ED Notes (Signed)
Pt ambulatory to and from bathroom with standby assist. Pt reconnected to [facility] monitoring, [facility].

## 2022-09-22 NOTE — ED Provider Notes (Signed)
[facility] Provider Note   Event Date/Time   First MD Initiated Contact with Patient 09/22/22 1005     (approximate) History  Flank Pain  HPI Sally Wilson is a 72 y.o. female with a stated past medical history of kidney stones who presents for left flank pain with associated fever, and nausea/vomiting.  Patient does endorse positive sick contacts at home with a COVID infection diagnosed last week.  Patient states that pain is in the left flank, nonradiating, intermittent colicky in nature, and 10/10 when present.  Patient states that this pain is similar to kidney stone pain she has had in the past.  Patient reportedly had a near syncopal event in triage with a drop in blood pressure to 80s/60s. ROS: Patient currently denies any vision changes, tinnitus, difficulty speaking, facial droop, sore throat, chest pain, shortness of breath, diarrhea, dysuria, or weakness/numbness/paresthesias in any extremity   Physical Exam  Triage Vital Signs: ED Triage Vitals  Encounter Vitals Group     BP 09/22/22 0956 (!) 88/63     Systolic BP Percentile --      Diastolic BP Percentile --      Pulse Rate 09/22/22 0956 93     Resp 09/22/22 0956 18     Temp 09/22/22 0958 99 F (37.2 C)     Temp Source 09/22/22 0958 Oral     SpO2 09/22/22 0956 95 %     Weight 09/22/22 0958 138 lb (62.6 kg)     Height 09/22/22 0958 5\' 6"  (1.676 m)     Head Circumference --      Peak Flow --      Pain Score 09/22/22 0958 10     Pain Loc --      Pain Education --      Exclude from Growth Chart --    Most recent vital signs: Vitals:   09/22/22 1015 09/22/22 1030  BP: 104/63 113/88  Pulse: 72 76  Resp: 10 11  Temp:    SpO2: 100% 100%   General: Awake, oriented x4. CV:  Good peripheral perfusion.  Resp:  Normal effort.  Abd:  No distention.  Other:  Elderly well-developed, well-nourished Caucasian female laying in bed in no acute distress ED Results / Procedures / Treatments   Labs (all labs ordered are listed, but only abnormal results are displayed) Labs Reviewed  RESP PANEL BY RT-PCR (RSV, FLU A&B, COVID)  RVPGX2 - Abnormal; Notable for the following components:      Result Value   SARS Coronavirus 2 by RT PCR POSITIVE (*)    All other components within normal limits  COMPREHENSIVE METABOLIC PANEL - Abnormal; Notable for the following components:   Sodium 133 (*)    Potassium 3.4 (*)    CO2 21 (*)    Glucose, Bld 133 (*)    Calcium 8.5 (*)    All other components within normal limits  CBC WITH DIFFERENTIAL/PLATELET - Abnormal; Notable for the following components:   Platelets 122 (*)    All other components within normal limits  URINALYSIS, W/ REFLEX TO CULTURE (INFECTION SUSPECTED) - Abnormal; Notable for the following components:   Color, Urine YELLOW (*)    APPearance HAZY (*)    Hgb urine dipstick SMALL (*)    Ketones, ur 5 (*)    Bacteria, UA RARE (*)    All other components within normal limits  CULTURE, BLOOD (ROUTINE X 2)  CULTURE, BLOOD (ROUTINE X 2)  LACTIC  ACID, PLASMA  PROTIME-INR  APTT  LACTIC ACID, PLASMA   EKG ED ECG REPORT I, Merwyn Katos, the attending physician, personally viewed and interpreted this ECG. Date: 09/22/2022 EKG Time: 1005 Rate: 60 Rhythm: normal sinus rhythm QRS Axis: normal Intervals: normal ST/T Wave abnormalities: normal Narrative Interpretation: no evidence of acute ischemia RADIOLOGY ED MD interpretation: CT noncontrast of the abdomen and pelvis shows a subacute healing fracture of the left lateral ninth rib with a 5 mm right renal calculus and no evidence of ureteral calculi or hydronephrosis.  One-view portable chest x-ray interpreted by me shows no evidence of acute abnormalities including no pneumonia, pneumothorax, or widened mediastinum.  There is evidence of possible bronchitis -Agree with radiology assessment Official radiology report(s): CT Renal Stone Study  Result Date:  09/22/2022 CLINICAL DATA:  Left flank pain for 2 weeks.  Nephrolithiasis. EXAM: CT ABDOMEN AND PELVIS WITHOUT CONTRAST TECHNIQUE: Multidetector CT imaging of the abdomen and pelvis was performed following the standard protocol without IV contrast. RADIATION DOSE REDUCTION: This exam was performed according to the departmental dose-optimization program which includes automated exposure control, adjustment of the mA and/or kV according to patient size and/or use of iterative reconstruction technique. COMPARISON:  07/19/2021 FINDINGS: Lower chest: No acute findings. Hepatobiliary: No mass visualized on this unenhanced exam. Gallbladder is unremarkable. No evidence of biliary ductal dilatation. Pancreas: No mass or inflammatory process visualized on this unenhanced exam. Spleen:  Within normal limits in size. Adrenals/Urinary tract: 5 mm calculus again seen in lower pole of right kidney. No evidence of ureteral calculi or hydronephrosis. Unremarkable unopacified urinary bladder. Stomach/Bowel: No evidence of obstruction, inflammatory process, or abnormal fluid collections. Diverticulosis is seen mainly involving the descending and sigmoid colon, however there is no evidence of diverticulitis. Normal appendix visualized. Vascular/Lymphatic: No pathologically enlarged lymph nodes identified. No evidence of abdominal aortic aneurysm. Reproductive: Stable tiny calcified uterine fibroid again noted. No other significant abnormality. Other:  None. Musculoskeletal: No suspicious bone lesions identified. A subacute healing fracture is seen involving the left lateral 9th rib. IMPRESSION: Subacute, healing fracture of left lateral 9th rib. 5 mm right renal calculus. No evidence of ureteral calculi or hydronephrosis. Colonic diverticulosis, without radiographic evidence of diverticulitis. Stable tiny calcified uterine fibroid. Electronically Signed   By: Danae Orleans M.D.   On: 09/22/2022 12:45   DG Chest Port 1 View  Result  Date: 09/22/2022 CLINICAL DATA:  72 year old female with possible sepsis. EXAM: PORTABLE CHEST 1 VIEW COMPARISON:  None available. FINDINGS: Lung volumes are normal. No consolidative airspace disease. Mild diffuse interstitial prominence and peribronchial cuffing. No pleural effusions. No pneumothorax. No pulmonary nodule or mass noted. Pulmonary vasculature and the cardiomediastinal silhouette are within normal limits. Atherosclerosis in the thoracic aorta. IMPRESSION: 1. The appearance of the chest suggest an acute bronchitis, as above. 2. Aortic atherosclerosis. Electronically Signed   By: Trudie Reed M.D.   On: 09/22/2022 10:41   PROCEDURES: Critical Care performed: No Procedures MEDICATIONS ORDERED IN ED: Medications  lactated ringers infusion ( Intravenous New Bag/Given 09/22/22 1113)  ondansetron (ZOFRAN) injection 4 mg (4 mg Intravenous Given 09/22/22 1010)  sodium chloride 0.9 % bolus 1,000 mL (0 mLs Intravenous Stopped 09/22/22 1108)    And  sodium chloride 0.9 % bolus 1,000 mL (0 mLs Intravenous Stopped 09/22/22 1108)  meropenem (MERREM) 1 g in sodium chloride 0.9 % 100 mL IVPB (0 g Intravenous Stopped 09/22/22 1108)  ketorolac (TORADOL) 30 MG/ML injection 15 mg (15 mg Intravenous Given 09/22/22 1204)  IMPRESSION / MDM / ASSESSMENT AND PLAN / ED COURSE  I reviewed the triage vital signs and the nursing notes.                             The patient is on the cardiac monitor to evaluate for evidence of arrhythmia and/or significant heart rate changes. Patient's presentation is most consistent with acute presentation with potential threat to life or bodily function. Presentation most consistent with Viral Syndrome.  Patient has tested positive for COVID-19. Based on vitals and exam they are nontoxic and stable for discharge.  Given History and Exam I have a lower suspicion for: Emergent CardioPulmonary causes [such as Acute Asthma or COPD Exacerbation, acute Heart Failure or  exacerbation, PE, PTX, atypical ACS, PNA]. Emergent Otolaryngeal causes [such as PTA, RPA, Ludwigs, Epiglottitis, EBV]. CT showing no new kidney stones. Regarding Emergent Travel or Immunosuppressive related infectious: I have a low suspicion for acute HIV.  Will provide strict return precautions and instructions on self-isolation/quarantine and anticipatory guidance.   FINAL CLINICAL IMPRESSION(S) / ED DIAGNOSES   Final diagnoses:  COVID-19 virus infection  Left flank pain   Rx / DC Orders   ED Discharge Orders          Ordered    ondansetron (ZOFRAN-ODT) 8 MG disintegrating tablet  Every 8 hours PRN        09/22/22 1318    ketorolac (TORADOL) 10 MG tablet  Every 6 hours PRN       Note to Pharmacy: Patient given an IM/IV loading dose in emergency department   09/22/22 1318           Note:  This document was prepared using Dragon voice recognition software and may include unintentional dictation errors.   Merwyn Katos, MD 09/22/22 1326

## 2022-09-22 NOTE — ED Notes (Signed)
Patient transported to CT 

## 2022-09-22 NOTE — Consult Note (Signed)
PHARMACY -  BRIEF ANTIBIOTIC NOTE   Pharmacy has received consult(s) for meropenem from an ED provider.  The patient's profile has been reviewed for ht/wt/allergies/indication/available labs.    One time order(s) placed for  Meropenem 1 gram x 1   Further antibiotics/pharmacy consults should be ordered by admitting physician if indicated.                       Thank you, Sharen Hones, PharmD, BCPS Clinical Pharmacist   09/22/2022  10:28 AM

## 2022-09-22 NOTE — ED Notes (Signed)
Pt A&O x4, no obvious distress noted, respirations regular/unlabored. Pt verbalizes understanding of discharge instructions. Pt able to ambulate from ED independently.   

## 2022-09-22 NOTE — ED Triage Notes (Signed)
Patient to ED via POV for left sided flank pain x2 weeks. Has known kidney stones. Took a 1/2 of a  percocet at 8am.

## 2022-09-22 NOTE — ED Notes (Signed)
Pt endorsing she is feeling much better. Color has returned to pt face, no longer pale. WCTM. VSS. Call light in reach. Family at bedside.

## 2022-09-22 NOTE — Code Documentation (Signed)
CODE SEPSIS - PHARMACY COMMUNICATION  **Broad Spectrum Antibiotics should be administered within 1 hour of Sepsis diagnosis**  Time Code Sepsis Called/Page Received: 1007  Antibiotics Ordered: meropenem  Time of 1st antibiotic administration: 1037  Additional action taken by pharmacy:   If necessary, Name of Provider/Nurse Contacted:     Sharen Hones, PharmD, BCPS Clinical Pharmacist   09/22/2022  11:36 AM

## 2022-09-22 NOTE — ED Notes (Signed)
Pt brought to room, endorsing she feels like she's going to pass out. Pt pale at this time and nauseated. Pt assisted into bed. Dr Vicente Males to bedside at this time

## 2022-09-23 ENCOUNTER — Other Ambulatory Visit: Payer: Self-pay | Admitting: Physician Assistant

## 2022-09-23 DIAGNOSIS — R1012 Left upper quadrant pain: Secondary | ICD-10-CM

## 2022-09-23 DIAGNOSIS — R319 Hematuria, unspecified: Secondary | ICD-10-CM

## 2022-11-28 ENCOUNTER — Other Ambulatory Visit: Payer: Self-pay | Admitting: Internal Medicine

## 2022-11-28 DIAGNOSIS — Z1231 Encounter for screening mammogram for malignant neoplasm of breast: Secondary | ICD-10-CM

## 2023-03-20 ENCOUNTER — Ambulatory Visit: Payer: Medicare Other | Admitting: Dermatology

## 2023-04-24 ENCOUNTER — Ambulatory Visit: Payer: Medicare Other | Admitting: Dermatology

## 2023-04-24 ENCOUNTER — Encounter: Payer: Self-pay | Admitting: Dermatology

## 2023-04-24 DIAGNOSIS — L578 Other skin changes due to chronic exposure to nonionizing radiation: Secondary | ICD-10-CM

## 2023-04-24 DIAGNOSIS — Z86018 Personal history of other benign neoplasm: Secondary | ICD-10-CM

## 2023-04-24 DIAGNOSIS — Z1283 Encounter for screening for malignant neoplasm of skin: Secondary | ICD-10-CM | POA: Diagnosis not present

## 2023-04-24 DIAGNOSIS — D1801 Hemangioma of skin and subcutaneous tissue: Secondary | ICD-10-CM

## 2023-04-24 DIAGNOSIS — W908XXA Exposure to other nonionizing radiation, initial encounter: Secondary | ICD-10-CM

## 2023-04-24 DIAGNOSIS — H61001 Unspecified perichondritis of right external ear: Secondary | ICD-10-CM

## 2023-04-24 DIAGNOSIS — C44712 Basal cell carcinoma of skin of right lower limb, including hip: Secondary | ICD-10-CM | POA: Diagnosis not present

## 2023-04-24 DIAGNOSIS — L821 Other seborrheic keratosis: Secondary | ICD-10-CM

## 2023-04-24 DIAGNOSIS — Z79899 Other long term (current) drug therapy: Secondary | ICD-10-CM

## 2023-04-24 DIAGNOSIS — L814 Other melanin hyperpigmentation: Secondary | ICD-10-CM

## 2023-04-24 DIAGNOSIS — D229 Melanocytic nevi, unspecified: Secondary | ICD-10-CM

## 2023-04-24 DIAGNOSIS — D492 Neoplasm of unspecified behavior of bone, soft tissue, and skin: Secondary | ICD-10-CM | POA: Diagnosis not present

## 2023-04-24 DIAGNOSIS — L918 Other hypertrophic disorders of the skin: Secondary | ICD-10-CM

## 2023-04-24 DIAGNOSIS — C4491 Basal cell carcinoma of skin, unspecified: Secondary | ICD-10-CM

## 2023-04-24 DIAGNOSIS — L820 Inflamed seborrheic keratosis: Secondary | ICD-10-CM

## 2023-04-24 DIAGNOSIS — Z7189 Other specified counseling: Secondary | ICD-10-CM

## 2023-04-24 HISTORY — DX: Basal cell carcinoma of skin, unspecified: C44.91

## 2023-04-24 NOTE — Progress Notes (Signed)
 Follow-Up Visit   Subjective  Sally Wilson is a 73 y.o. female who presents for the following: Skin Cancer Screening and Full Body Skin Exam, hx of Dysplastic nevus, recheck her right ear hx of CNCH treating with Mometasone cream prn The patient presents for Total-Body Skin Exam (TBSE) for skin cancer screening and mole check. The patient has spots, moles and lesions to be evaluated, some may be new or changing and the patient may have concern these could be cancer.  The following portions of the chart were reviewed this encounter and updated as appropriate: medications, allergies, medical history  Review of Systems:  No other skin or systemic complaints except as noted in HPI or Assessment and Plan.  Objective  Well appearing patient in no apparent distress; mood and affect are within normal limits.  A full examination was performed including scalp, head, eyes, ears, nose, lips, neck, chest, axillae, abdomen, back, buttocks, bilateral upper extremities, bilateral lower extremities, hands, feet, fingers, toes, fingernails, and toenails. All findings within normal limits unless otherwise noted below.   Relevant physical exam findings are noted in the Assessment and Plan.  right side burn x 1, anterior neck x 1 (2) Stuck-on, waxy, tan-brown papule or plaque --Discussed benign etiology and prognosis.   Right Ear mid helix  0.4 cm pink patch- photo taken  right medial knee 1.5 cm pink irregular patch         Assessment & Plan   SKIN CANCER SCREENING PERFORMED TODAY.  ACTINIC DAMAGE - Chronic condition, secondary to cumulative UV/sun exposure - diffuse scaly erythematous macules with underlying dyspigmentation - Recommend daily broad spectrum sunscreen SPF 30+ to sun-exposed areas, reapply every 2 hours as needed.  - Staying in the shade or wearing long sleeves, sun glasses (UVA+UVB protection) and wide brim hats (4-inch brim around the entire circumference of the hat) are  also recommended for sun protection.  - Call for new or changing lesions.  LENTIGINES, SEBORRHEIC KERATOSES, HEMANGIOMAS - Benign normal skin lesions - Benign-appearing - Call for any changes  MELANOCYTIC NEVI - Tan-brown and/or pink-flesh-colored symmetric macules and papules - Benign appearing on exam today - Observation - Call clinic for new or changing moles - Recommend daily use of broad spectrum spf 30+ sunscreen to sun-exposed areas.   Chondrodermatitis nodularis helicis of right ear Right Ear mid helix  0.4 cm pink patch- photo taken  Chondrodermatitis Nodularis Chronica Helicis (CNCH or CNH) is a common, benign inflammatory condition of the ear cartilage and overlying skin associated with very sensitive tender papule(s).  Trauma or pressure from sleeping on the ear or from cell phone use and sun damage may be exacerbating factors.  Treatment may include using a C-shaped airplane neck pillow for sleeping on the side of the head so no pressure is on the ear.  Other treatments include topical or intralesional steroids; liquid nitrogen or laser destruction; shave removal or excision.  The condition can be difficult to treat and persist or recur despite treatment.  Continue  Mometasone cr qhs aa prn flares  If any change at all in the future by measurement or photo comparison, we will consider biopsy.  ACROCHORDONS (Skin Tags) - Removal desired by patient - Fleshy, skin-colored pedunculated papules - Benign appearing.  - Patient desires removal. Reviewed that this is not covered by insurance and they will be charged a cosmetic fee for removal. Patient signed non-covered consent.  - Prior to procedure, discussed risks of blister formation, small wound, skin dyspigmentation,  or rare scar following cryotherapy.  Destruction Procedure Note Destruction method: cryotherapy neck Informed consent: discussed and consent obtained   Lesion destroyed using liquid nitrogen: Yes   Outcome:  patient tolerated procedure well with no complications   Post-procedure details: wound care instructions given   Locations: neck  # of Lesions Treated: 1   HISTORY OF DYSPLASTIC NEVUS Left upper back paraspinal 2.5 cm lateral to spine No evidence of recurrence today Recommend regular full body skin exams Recommend daily broad spectrum sunscreen SPF 30+ to sun-exposed areas, reapply every 2 hours as needed.  Call if any new or changing lesions are noted between office visits   INFLAMED SEBORRHEIC KERATOSIS (2) right side burn x 1, anterior neck x 1 (2) Symptomatic, irritating, patient would like treated.  Destruction of lesion - right side burn x 1, anterior neck x 1 (2) Complexity: simple   Destruction method: cryotherapy   Informed consent: discussed and consent obtained   Timeout:  patient name, date of birth, surgical site, and procedure verified Lesion destroyed using liquid nitrogen: Yes   Region frozen until ice ball extended beyond lesion: Yes   Outcome: patient tolerated procedure well with no complications   Post-procedure details: wound care instructions given   NEOPLASM OF SKIN right medial knee Epidermal / dermal shaving  Lesion diameter (cm):  1.5 Informed consent: discussed and consent obtained   Timeout: patient name, date of birth, surgical site, and procedure verified   Patient was prepped and draped in usual sterile fashion: area prepped with alcohol. Anesthesia: the lesion was anesthetized in a standard fashion   Anesthetic:  1% lidocaine w/ epinephrine 1-100,000 local infiltration Instrument used: flexible razor blade   Hemostasis achieved with: pressure, aluminum chloride and electrodesiccation   Outcome: patient tolerated procedure well   Post-procedure details: wound care instructions given   Post-procedure details comment:  Ointment and a small bandage applied  Destruction of lesion  Destruction method: electrodesiccation and curettage   Informed  consent: discussed and consent obtained   Timeout:  patient name, date of birth, surgical site, and procedure verified Anesthesia: the lesion was anesthetized in a standard fashion   Anesthetic:  1% lidocaine w/ epinephrine 1-100,000 buffered w/ 8.4% NaHCO3 Curettage performed in three different directions: Yes   Electrodesiccation performed over the curetted area: Yes   Curettage cycles:  3 Final wound size (cm):  1.5 Hemostasis achieved with:  electrodesiccation Outcome: patient tolerated procedure well with no complications   Post-procedure details: sterile dressing applied and wound care instructions given   Dressing type: petrolatum   Specimen 1 - Surgical pathology Differential Diagnosis: R/O SCC vs BCC  Treated with EDC Check Margins: No SKIN TAGS, MULTIPLE ACQUIRED   ACTINIC SKIN DAMAGE   SKIN CANCER SCREENING   LENTIGO   MELANOCYTIC NEVUS, UNSPECIFIED LOCATION   HISTORY OF DYSPLASTIC NEVUS   CHONDRODERMATITIS NODULARIS HELICIS OF RIGHT EAR   Related Medications mometasone (ELOCON) 0.1 % cream Apply 1 application topically daily as needed (Rash). Qd to aa right ear prn flares COUNSELING AND COORDINATION OF CARE   MEDICATION MANAGEMENT     Return in about 1 year (around 04/23/2024) for TBSE, hx of Dysplastic nevus .  IAngelique Holm, CMA, am acting as scribe for Armida Sans, MD .   Documentation: I have reviewed the above documentation for accuracy and completeness, and I agree with the above.  Armida Sans, MD

## 2023-04-24 NOTE — Patient Instructions (Addendum)

## 2023-04-28 ENCOUNTER — Encounter: Payer: Self-pay | Admitting: Dermatology

## 2023-04-28 LAB — SURGICAL PATHOLOGY

## 2023-04-29 ENCOUNTER — Telehealth: Payer: Self-pay

## 2023-04-29 NOTE — Telephone Encounter (Addendum)
 Called and discussed bx results with patient. She verbalized understanding and denied further questions. Will recheck at next follow up.   ----- Message from Armida Sans sent at 04/28/2023  7:06 PM EDT ----- FINAL DIAGNOSIS        1. Skin, right medial knee :       SUPERFICIAL BASAL CELL CARCINOMA, UNDERLYING INVASION NOT ENTIRELY EXCLUDED   Cancer = BCC Already treated Recheck next visit

## 2023-05-29 ENCOUNTER — Ambulatory Visit
Admission: RE | Admit: 2023-05-29 | Discharge: 2023-05-29 | Disposition: A | Discharge status: Home / Self Care | Source: Ambulatory Visit | Attending: Internal Medicine | Admitting: Internal Medicine

## 2023-05-29 DIAGNOSIS — Z1231 Encounter for screening mammogram for malignant neoplasm of breast: Secondary | ICD-10-CM | POA: Insufficient documentation

## 2023-12-12 NOTE — Patient Instructions (Addendum)
 Your procedure is scheduled on:    [facility] NOVEMBER 12  Report to the Registration Desk on the 1st floor of the [facility]. To find out your arrival time, please call [phone] between 1PM - 3PM on:   TUESDAY  NOVEMBER 11 If your arrival time is 6:00 am, do not arrive before that time as the [facility] entrance doors do not open until 6:00 am.  REMEMBER: Instructions that are not followed completely may result in serious medical risk, up to and including death; or upon the discretion of your surgeon and anesthesiologist your surgery may need to be rescheduled.  Do not eat food after midnight the night before surgery.  No gum chewing or hard candies.   One week prior to surgery: Stop Anti-inflammatories (NSAIDS) such as Advil, Aleve, Ibuprofen, Motrin, Naproxen, Naprosyn and Aspirin based products such as Excedrin, Goody's Powder, BC Powder. Stop ANY OVER THE COUNTER supplements until after surgery. cholecalciferol (VITAMIN D3)  ferrous sulfate  325 (65 FE)  Shaklee HerbLax  vitamin B-12 (CYANOCOBALAMIN )   You may however, continue to take Tylenol  if needed for pain up until the day of surgery.  Continue taking all of your other prescription medications up until the day of surgery.  ON THE DAY OF SURGERY DO NOT TAKE NAY MEDICATIONS   No Alcohol for 24 hours before or after surgery.  No Smoking including e-cigarettes for 24 hours before surgery.   No nicotine  patches on the day of surgery.  Do not use any recreational drugs for at least a week (preferably 2 weeks) before your surgery.  Please be advised that the combination of cocaine and anesthesia may have negative outcomes, up to and including death. If you test positive for cocaine, your surgery will be cancelled.  On the morning of surgery brush your teeth with toothpaste and water, you may rinse your mouth with mouthwash if you wish. Do not swallow any toothpaste or mouthwash.  Do not wear jewelry, make-up,  hairpins, clips or nail polish.  For welded (permanent) jewelry: bracelets, anklets, waist bands, etc.  Please have this removed prior to surgery.  If it is not removed, there is a chance that hospital personnel will need to cut it off on the day of surgery.  Do not wear lotions, powders, or perfumes.   Do not shave body hair from the neck down 48 hours before surgery.  Do not bring valuables to the hospital. [facility] is not responsible for any missing/lost belongings or valuables.   Notify your doctor if there is any change in your medical condition (cold, fever, infection).  Wear comfortable clothing (specific to your surgery type) to the hospital.  After surgery, you can help prevent lung complications by doing breathing exercises.  Take deep breaths and cough every 1-2 hours.   If you are being discharged the day of surgery, you will not be allowed to drive home. You will need a responsible individual to drive you home and stay with you for 24 hours after surgery.   If you are taking public transportation, you will need to have a responsible individual with you.  Please call the Pre-admissions Testing Dept. at [phone] if you have any questions about these instructions.  Surgery Visitation Policy:  Patients having surgery or a procedure may have two visitors.  Children under the age of 50 must have an adult with them who is not the patient.   Merchandiser, Retail to address health-related social needs:  [email or web address]

## 2023-12-15 ENCOUNTER — Encounter
Admission: RE | Admit: 2023-12-15 | Discharge: 2023-12-15 | Disposition: A | Discharge status: Home / Self Care | Source: Ambulatory Visit | Attending: Obstetrics and Gynecology | Admitting: Obstetrics and Gynecology

## 2023-12-15 ENCOUNTER — Other Ambulatory Visit: Payer: Self-pay

## 2023-12-15 VITALS — Ht 66.5 in | Wt 140.0 lb

## 2023-12-15 DIAGNOSIS — Z01810 Encounter for preprocedural cardiovascular examination: Secondary | ICD-10-CM

## 2023-12-15 DIAGNOSIS — Z01818 Encounter for other preprocedural examination: Secondary | ICD-10-CM | POA: Diagnosis present

## 2023-12-15 DIAGNOSIS — N390 Urinary tract infection, site not specified: Secondary | ICD-10-CM

## 2023-12-15 DIAGNOSIS — R9431 Abnormal electrocardiogram [ECG] [EKG]: Secondary | ICD-10-CM | POA: Diagnosis not present

## 2023-12-15 DIAGNOSIS — R002 Palpitations: Secondary | ICD-10-CM | POA: Diagnosis not present

## 2023-12-15 DIAGNOSIS — Z8249 Family history of ischemic heart disease and other diseases of the circulatory system: Secondary | ICD-10-CM

## 2023-12-15 DIAGNOSIS — Z01812 Encounter for preprocedural laboratory examination: Secondary | ICD-10-CM

## 2023-12-15 HISTORY — DX: Polyp of colon: K63.5

## 2023-12-15 HISTORY — DX: Depression, unspecified: F32.A

## 2023-12-15 LAB — URINALYSIS, ROUTINE W REFLEX MICROSCOPIC
Bilirubin Urine: NEGATIVE
Glucose, UA: NEGATIVE mg/dL
Ketones, ur: NEGATIVE mg/dL
Leukocytes,Ua: NEGATIVE
Nitrite: NEGATIVE
Protein, ur: NEGATIVE mg/dL
Specific Gravity, Urine: 1.013 (ref 1.005–1.030)
pH: 5 (ref 5.0–8.0)

## 2023-12-15 LAB — BASIC METABOLIC PANEL WITH GFR
Anion gap: 10 (ref 5–15)
BUN: 13 mg/dL (ref 8–23)
CO2: 25 mmol/L (ref 22–32)
Calcium: 9.1 mg/dL (ref 8.9–10.3)
Chloride: 104 mmol/L (ref 98–111)
Creatinine, Ser: 1.16 mg/dL — ABNORMAL HIGH (ref 0.44–1.00)
GFR, Estimated: 50 mL/min — ABNORMAL LOW (ref >60)
Glucose, Bld: 96 mg/dL (ref 70–99)
Potassium: 3.6 mmol/L (ref 3.5–5.1)
Sodium: 139 mmol/L (ref 135–145)

## 2023-12-15 LAB — CBC
HCT: 39.8 % (ref 36.0–46.0)
Hemoglobin: 13.4 g/dL (ref 12.0–15.0)
MCH: 30.6 pg (ref 26.0–34.0)
MCHC: 33.7 g/dL (ref 30.0–36.0)
MCV: 90.9 fL (ref 80.0–100.0)
Platelets: 154 K/uL (ref 150–400)
RBC: 4.38 MIL/uL (ref 3.87–5.11)
RDW: 13 % (ref 11.5–15.5)
WBC: 6.3 K/uL (ref 4.0–10.5)
nRBC: 0 % (ref 0.0–0.2)

## 2023-12-16 NOTE — H&P (Signed)
 Sally Wilson is a 73 y.o. female here for Pre Op Consulting (Sign consents)   History of Present Illness: Patient presents for a preoperative visit to schedule a D&C, hysteroscopy for pyometria. She also has ASC-H with a normal colopscopy as below   Workup has included:   Coloposcopy- 11/07/23 Part A-Cervical Biopsy,7:00: CHRONIC CERVICITIS. NEGATIVE  FOR DYSPLASIA, VIRAL CYTOPATHIC EFFECT, OR MALIGNANCY. TRANSFORMATION ZONE NOT REPRESENTED.   Part B-Endocervical Curettings: MINUTE FRAGMENTS OF BENIGN ENDOCERVICAL GLANDS, SQUAMOUS EPITHELIUM, SQUAMOUS METAPLASIA, BLOOD, INFLAMMATION AND MUCUS.   Part C-Endometrial Biopsy: PURULENT MATERIAL AND EPITHELIAL FRAGMENTS CONSISTENT WITH PYOMETRA. NO ATYPICAL HYPERPLASIA OR CARCINOMA.     We treated with Augmentin , diflucan , and zofran . Plan for [facility]   Past Medical History:  has a past medical history of Anemia (07/13/2021), Anxiety, Depression, History of abnormal cervical Pap smear, and Osteoporosis.  Past Surgical History:  has a past surgical history that includes foot surgery (Right); Tubal ligation; Breast surgery (Left); Colonoscopy (10/2020); egd; repair blepharoptosis w/levator muscle resection external approach (Bilateral, 09/28/2019); and Breast biopsy. Family History: family history includes Arthritis in her brother and mother; Colon cancer in her brother and mother; Depression in her sister; High blood pressure (Hypertension) in her father; Liver disease in her brother; Myocardial Infarction (Heart attack) in her father; Osteoarthritis in her mother; Osteoporosis (Thinning of bones) in her mother. Social History:  reports that she has been smoking cigarettes. She has a 55 pack-year smoking history. She has been exposed to tobacco smoke. She has never used smokeless tobacco. She reports that she does not drink alcohol and does not use drugs. OB/GYN History:  OB History       Gravida  2   Para  2   Term  2   Preterm      AB       Living  2        SAB      IAB      Ectopic      Molar      Multiple      Live Births                Allergies: is allergic to cephalexin , codeine, and penicillin. Medications:  Current Medications    Current Outpatient Medications:    cholecalciferol (VITAMIN D3) 1000 unit tablet, Take 1,000 Units by mouth once daily, Disp: , Rfl:    clonazePAM  (KLONOPIN ) 1 MG tablet, TAKE 1 TABLET BY MOUTH TWICE DAILY AS NEEDED, Disp: 180 tablet, Rfl: 1   hyoscyamine (LEVSIN) 0.125 mg tablet, Take 1 tablet (0.125 mg total) by mouth once daily as needed, Disp: 30 tablet, Rfl: 1   linaCLOtide (LINZESS) 145 mcg capsule, Take 1 capsule (145 mcg total) by mouth once daily, Disp: 90 capsule, Rfl: 1   PAXIL  CR 25 mg CR tablet, TAKE 1 TABLET BY MOUTH ONCE DAILY, Disp: 90 tablet, Rfl: 1   traZODone  (DESYREL ) 100 MG tablet, TAKE 1 TABLET BY MOUTH AT BEDTIME, Disp: 90 tablet, Rfl: 1   albuterol 90 mcg/actuation inhaler, Inhale 2 inhalations into the lungs every 4 (four) hours as needed for Wheezing, Disp: 1 each, Rfl: 1     Review of Systems: No SOB, no palpitations or chest pain, no new lower extremity edema, no nausea or vomiting or bowel or bladder complaints. See HPI for gyn specific ROS.    Exam:    BP 93/62   Pulse 97   Ht 171.5 cm (5' 7.5)   Wt 64.6 kg (142  lb 6.4 oz)   BMI 21.97 kg/m    General: Patient is well-groomed, well-nourished, appears stated age in no acute distress   HEENT: head is atraumatic and normocephalic, trachea is midline, neck is supple with no palpable nodules   CV: Regular rhythm and normal heart rate, no murmur   Pulm: Clear to auscultation throughout lung fields with no wheezing, crackles, or rhonchi. No increased work of breathing   Abdomen: soft , no mass, non-tender, no rebound tenderness, no hepatomegaly   Pelvicdeferred   Impression:    The encounter diagnosis was Fluid in endometrial cavity.   Plan:    1.  Preoperative visit: D&C  hysteroscopy. Consents signed today.  -Risks of surgery were discussed with the patient including but not limited to: bleeding which may require transfusion; infection which may require antibiotics; injury to uterus or surrounding organs; intrauterine scarring which may impair future fertility; need for additional procedures including laparotomy or laparoscopy; and other postoperative/anesthesia complications. Written informed consent was obtained. Assessment & Plan Fluid in endometrial cavity Presence of infectious fluid in the endometrium post-menopause, atypical and concerning for potential malignancy. White blood cells in the fluid suggest infection or other pathology. - Will perform hysteroscopy with curettage to remove abnormal tissue and send for pathology to rule out malignancy. - Discussed risks of procedure including bleeding, infection, and organ damage, though unlikely due to non-invasive nature. - Obtained consent for procedure and discussed potential need for blood transfusion, which she declined.   Cervical stenosis May complicate procedures requiring cervical dilation. - Will proceed with hysteroscopy and curettage, taking care to avoid cervical damage during dilation.     She does decline pRBCS but accepts albumin. This was signed today.   This is a scheduled same-day surgery. She will have a postop visit in 2 weeks to review operative findings and pathology.

## 2023-12-24 ENCOUNTER — Ambulatory Visit: Payer: Self-pay | Admitting: Urgent Care

## 2023-12-24 ENCOUNTER — Encounter: Admission: RE | Disposition: A | Payer: Self-pay | Source: Home / Self Care | Attending: Obstetrics and Gynecology

## 2023-12-24 ENCOUNTER — Ambulatory Visit
Admission: RE | Admit: 2023-12-24 | Discharge: 2023-12-24 | Disposition: A | Discharge status: Home / Self Care | Attending: Obstetrics and Gynecology | Admitting: Obstetrics and Gynecology

## 2023-12-24 ENCOUNTER — Other Ambulatory Visit: Payer: Self-pay

## 2023-12-24 ENCOUNTER — Encounter: Payer: Self-pay | Admitting: Obstetrics and Gynecology

## 2023-12-24 DIAGNOSIS — N859 Noninflammatory disorder of uterus, unspecified: Secondary | ICD-10-CM

## 2023-12-24 DIAGNOSIS — N719 Inflammatory disease of uterus, unspecified: Secondary | ICD-10-CM | POA: Diagnosis not present

## 2023-12-24 DIAGNOSIS — Z780 Asymptomatic menopausal state: Secondary | ICD-10-CM | POA: Insufficient documentation

## 2023-12-24 DIAGNOSIS — F17210 Nicotine dependence, cigarettes, uncomplicated: Secondary | ICD-10-CM | POA: Insufficient documentation

## 2023-12-24 HISTORY — PX: HYSTEROSCOPY WITH D & C: SHX1775

## 2023-12-24 SURGERY — DILATATION AND CURETTAGE /HYSTEROSCOPY
Anesthesia: General | Site: Cervix

## 2023-12-24 MED ORDER — FENTANYL CITRATE (PF) 100 MCG/2ML IJ SOLN
25.0000 ug | INTRAMUSCULAR | Status: DC | PRN
  Administered 2023-12-24: 25 ug via INTRAVENOUS

## 2023-12-24 MED ORDER — DEXAMETHASONE SOD PHOSPHATE PF 10 MG/ML IJ SOLN
INTRAMUSCULAR | Status: DC | PRN
  Administered 2023-12-24: 10 mg via INTRAVENOUS

## 2023-12-24 MED ORDER — PROPOFOL 10 MG/ML IV BOLUS
INTRAVENOUS | Status: DC | PRN
  Administered 2023-12-24: 30 mg via INTRAVENOUS

## 2023-12-24 MED ORDER — MIDAZOLAM HCL (PF) 2 MG/2ML IJ SOLN
INTRAMUSCULAR | Status: DC | PRN
  Administered 2023-12-24: 2 mg via INTRAVENOUS

## 2023-12-24 MED ORDER — PROPOFOL 500 MG/50ML IV EMUL
INTRAVENOUS | Status: DC | PRN
  Administered 2023-12-24: 155 ug/kg/min via INTRAVENOUS

## 2023-12-24 MED ORDER — POVIDONE-IODINE 10 % EX SWAB: 2.0000 | Freq: Once | CUTANEOUS | Status: DC

## 2023-12-24 MED ORDER — ORAL CARE MOUTH RINSE: 15.0000 mL | Freq: Once | OROMUCOSAL | Status: AC

## 2023-12-24 MED ORDER — KETAMINE HCL 50 MG/5ML IJ SOSY
PREFILLED_SYRINGE | INTRAMUSCULAR | Status: AC
  Filled 2023-12-24: qty 5

## 2023-12-24 MED ORDER — FENTANYL CITRATE (PF) 100 MCG/2ML IJ SOLN
INTRAMUSCULAR | Status: AC
  Filled 2023-12-24: qty 2

## 2023-12-24 MED ORDER — SILVER NITRATE-POT NITRATE 75-25 % EX MISC
CUTANEOUS | Status: AC
  Filled 2023-12-24: qty 10

## 2023-12-24 MED ORDER — 0.9 % SODIUM CHLORIDE (POUR BTL) OPTIME
TOPICAL | Status: DC | PRN
  Administered 2023-12-24: 500 mL

## 2023-12-24 MED ORDER — CHLORHEXIDINE GLUCONATE 0.12 % MT SOLN
15.0000 mL | Freq: Once | OROMUCOSAL | Status: AC
  Administered 2023-12-24: 15 mL via OROMUCOSAL

## 2023-12-24 MED ORDER — ACETAMINOPHEN 10 MG/ML IV SOLN
INTRAVENOUS | Status: DC | PRN
  Administered 2023-12-24: 1000 mg via INTRAVENOUS

## 2023-12-24 MED ORDER — OXYCODONE HCL 5 MG PO TABS: 5.0000 mg | ORAL_TABLET | Freq: Once | ORAL | Status: DC | PRN

## 2023-12-24 MED ORDER — ACETAMINOPHEN 10 MG/ML IV SOLN
INTRAVENOUS | Status: AC
  Filled 2023-12-24: qty 100

## 2023-12-24 MED ORDER — ONDANSETRON HCL 4 MG/2ML IJ SOLN
INTRAMUSCULAR | Status: DC | PRN
  Administered 2023-12-24: 4 mg via INTRAVENOUS

## 2023-12-24 MED ORDER — IBUPROFEN 600 MG PO TABS
600.0000 mg | ORAL_TABLET | Freq: Four times a day (QID) | ORAL | 3 refills | Status: AC | PRN
Start: 2023-12-24 — End: ?

## 2023-12-24 MED ORDER — DOCUSATE SODIUM 100 MG PO CAPS: 100.0000 mg | ORAL_CAPSULE | Freq: Two times a day (BID) | ORAL | 2 refills | Status: AC | PRN

## 2023-12-24 MED ORDER — KETAMINE HCL 10 MG/ML IJ SOLN
INTRAMUSCULAR | Status: DC | PRN
  Administered 2023-12-24 (×5): 10 mg via INTRAVENOUS

## 2023-12-24 MED ORDER — LACTATED RINGERS IV SOLN: INTRAVENOUS | Status: DC

## 2023-12-24 MED ORDER — MIDAZOLAM HCL 2 MG/2ML IJ SOLN
INTRAMUSCULAR | Status: AC
  Filled 2023-12-24: qty 2

## 2023-12-24 MED ORDER — CHLORHEXIDINE GLUCONATE 0.12 % MT SOLN
OROMUCOSAL | Status: AC
  Filled 2023-12-24: qty 15

## 2023-12-24 MED ORDER — KETOROLAC TROMETHAMINE 30 MG/ML IJ SOLN
INTRAMUSCULAR | Status: DC | PRN
  Administered 2023-12-24: 15 mg via INTRAVENOUS

## 2023-12-24 MED ORDER — GLYCOPYRROLATE 0.2 MG/ML IJ SOLN
INTRAMUSCULAR | Status: DC | PRN
  Administered 2023-12-24: .2 mg via INTRAVENOUS

## 2023-12-24 MED ORDER — OXYCODONE HCL 5 MG/5ML PO SOLN: 5.0000 mg | Freq: Once | ORAL | Status: DC | PRN

## 2023-12-24 MED ORDER — PROPOFOL 1000 MG/100ML IV EMUL
INTRAVENOUS | Status: AC
  Filled 2023-12-24: qty 100

## 2023-12-24 SURGICAL SUPPLY — 21 items
BAG PRESSURE INF REUSE 1000 (BAG) ×1 IMPLANT
BASIN KIT SINGLE STR (MISCELLANEOUS) ×1 IMPLANT
DEVICE MYOSURE LITE (MISCELLANEOUS) IMPLANT
DEVICE MYOSURE REACH (MISCELLANEOUS) IMPLANT
DRSG TELFA 3X8 NADH STRL (GAUZE/BANDAGES/DRESSINGS) IMPLANT
ELECTRODE REM PT RTRN 9FT ADLT (ELECTROSURGICAL) ×1 IMPLANT
GLOVE BIO SURGEON STRL SZ7 (GLOVE) ×1 IMPLANT
GLOVE INDICATOR 7.5 STRL GRN (GLOVE) ×1 IMPLANT
GOWN STRL REUS W/ TWL LRG LVL3 (GOWN DISPOSABLE) ×2 IMPLANT
KIT PROCED FLUENT PRO FLT212S (KITS) ×1 IMPLANT
KIT TURNOVER CYSTO (KITS) ×1 IMPLANT
MANIFOLD NEPTUNE II (INSTRUMENTS) ×1 IMPLANT
PACK DNC HYST (MISCELLANEOUS) ×1 IMPLANT
PAD PREP OB/GYN DISP 24X41 (PERSONAL CARE ITEMS) ×1 IMPLANT
SCRUB CHG 4% DYNA-HEX 4OZ (MISCELLANEOUS) ×1 IMPLANT
SEAL ROD LENS SCOPE MYOSURE (ABLATOR) IMPLANT
SET CYSTO IRRIGATION (SET/KITS/TRAYS/PACK) IMPLANT
SOL .9 NS 3000ML IRR UROMATIC (IV SOLUTION) ×1 IMPLANT
TRAP FLUID SMOKE EVACUATOR (MISCELLANEOUS) ×1 IMPLANT
TUBING CONNECTING 10 (TUBING) ×1 IMPLANT
WATER STERILE IRR 500ML POUR (IV SOLUTION) ×1 IMPLANT

## 2023-12-24 NOTE — Op Note (Signed)
 Operative Report Hysteroscopy with Dilation and Curettage   Indications: Fluid in endometrial canal    Pre-operative Diagnosis: pyometra - postmenopausal   Post-operative Diagnosis: same.  Procedure: 1. Exam under anesthesia 2. Fractional D&C 3. Hysteroscopy  Surgeon: Heather Penton, MD  Assistant(s):  None  Anesthesia: Deep IV sedation  Anesthesiologist: Piscitello, Fairy POUR, MD Anesthesiologist: Stevan Fairy POUR, MD CRNA: Ledora Duncan, CRNA  Estimated Blood Loss:  Minimal  Total IV Fluids:  Urine Output: 50ml  Total Fluid Deficit:  60 mL          Specimens: Endocervical curettings, endometrial curettings         Complications:  None; patient tolerated the procedure well.         Disposition: PACU - hemodynamically stable.         Condition: stable  Findings: Uterus measuring 6 cm by sound; normal cervix, vagina, perineum. Smooth atrophic endometrium with some yellowing of the fundal tissue. No mass finding.  Indication for procedure/Consents: 73 y.o. F here for scheduled surgery for the aforementioned diagnoses.     Risks of surgery were discussed with the patient including but not limited to: bleeding which may require transfusion; infection which may require antibiotics; injury to uterus or surrounding organs; intrauterine scarring which may impair future fertility; need for additional procedures including laparotomy or laparoscopy; and other postoperative/anesthesia complications. Written informed consent was obtained.    Procedure Details:  D&C The patient was taken to the operating room where anesthesia was administered and was found to be adequate.  After a formal and adequate timeout was performed, she was placed in the dorsal lithotomy position and examined with the above findings. She was then prepped and draped in the sterile manner.   Her bladder was catheterized for an estimated amount of clear, yellow urine. A weighed speculum  was then placed in the patient's vagina and a single tooth tenaculum was applied to the anterior lip of the cervix.  An endocervical curette was performed. Her cervix was serially dilated to 15 French using Hanks dilators. The hysteroscope was introduced under direct observation  Using lactated ringers  as a distention medium to reveal the above findings. The uterine cavity was carefully examined, both ostia were recognized, and diffusely atrophic endometrium without concern for malignancy or growth noted.  After further careful visualization of the uterine cavity, the hysteroscope was removed under direct visualization.  A sharp curettage was then performed until there was a gritty texture in all four quadrants.  The tenaculum was removed from the anterior lip of the cervix and the vaginal speculum was removed after applying pressure for good hemostasis.   The patient tolerated the procedure well and was taken to the recovery area awake and in stable condition. She received iv acetaminophen  and Toradol  prior to leaving the OR.  The patient will be discharged to home as per PACU criteria. Routine postoperative instructions given.  She was prescribed Ibuprofen and Colace.  She will follow up in the clinic in two weeks for postoperative evaluation.

## 2023-12-24 NOTE — Anesthesia Preprocedure Evaluation (Signed)
 Anesthesia Evaluation  Patient identified by MRN, date of birth, ID band Patient awake    Reviewed: Allergy & Precautions, NPO status , Patient's Chart, lab work & pertinent test results  History of Anesthesia Complications Negative for: history of anesthetic complications  Airway Mallampati: III  TM Distance: >3 FB Neck ROM: full    Dental  (+) Chipped   Pulmonary Current Smoker and Patient abstained from smoking.   Pulmonary exam normal        Cardiovascular Exercise Tolerance: Good (-) angina (-) Past MI negative cardio ROS Normal cardiovascular exam     Neuro/Psych  PSYCHIATRIC DISORDERS      negative neurological ROS     GI/Hepatic negative GI ROS, Neg liver ROS,neg GERD  ,,  Endo/Other  negative endocrine ROS    Renal/GU Renal disease  negative genitourinary   Musculoskeletal   Abdominal   Peds  Hematology negative hematology ROS (+)   Anesthesia Other Findings Past Medical History: 03/05/2019: Age-related osteoporosis without current pathological  fracture No date: Arthritis 04/24/2023: BCC (basal cell carcinoma of skin)     Comment:  right medial knee (superficial bcc) treated with ED&C No date: Current tobacco use No date: Depression 03/08/2021: Dysplastic nevus     Comment:  left upper back paraspinal 2.5cm lat to spine- Excised               04/17/21 No date: IBS (irritable bowel syndrome) No date: Kidney stone No date: Polyp of ascending colon  Past Surgical History: 2005?: BREAST BIOPSY; Right     Comment:  poss stereotatic bx neg 11/02/2020: COLONOSCOPY WITH PROPOFOL ; N/A     Comment:  Procedure: COLONOSCOPY WITH PROPOFOL ;  Surgeon: Jinny Carmine, MD;  Location: [facility] ENDOSCOPY;  Service:               Endoscopy;  Laterality: N/A; No date: TUBAL LIGATION No date: wisdom  tooth  BMI    Body Mass Index: 21.93 kg/m      Reproductive/Obstetrics negative OB ROS                               Anesthesia Physical Anesthesia Plan  ASA: 2  Anesthesia Plan: General   Post-op Pain Management:    Induction: Intravenous  PONV Risk Score and Plan: Propofol  infusion and TIVA  Airway Management Planned: Natural Airway and Nasal Cannula  Additional Equipment:   Intra-op Plan:   Post-operative Plan:   Informed Consent: I have reviewed the patients History and Physical, chart, labs and discussed the procedure including the risks, benefits and alternatives for the proposed anesthesia with the patient or authorized representative who has indicated his/her understanding and acceptance.     Dental Advisory Given  Plan Discussed with: Anesthesiologist, CRNA and Surgeon  Anesthesia Plan Comments: (Patient consented for risks of anesthesia including but not limited to:  - adverse reactions to medications - risk of airway placement if required - damage to eyes, teeth, lips or other oral mucosa - nerve damage due to positioning  - sore throat or hoarseness - Damage to heart, brain, nerves, lungs, other parts of body or loss of life  Patient voiced understanding and assent.)        Anesthesia Quick Evaluation

## 2023-12-24 NOTE — Discharge Instructions (Signed)
 Discharge instructions after a hysteroscopy with dilation and curettage  Signs and Symptoms to Report  Call our office at [phone] if you have any of the following:    Fever over 100.4 degrees or higher  Severe stomach pain not relieved with pain medications  Bright red bleeding that's heavier than a period that does not slow with rest after the first 24 hours  To go the bathroom a lot (frequency), you can't hold your urine (urgency), or it hurts when you empty your bladder (urinate)  Chest pain  Shortness of breath  Pain in the calves of your legs  Severe nausea and vomiting not relieved with anti-nausea medications  Any concerns  What You Can Expect after Surgery  You may see some pink tinged, bloody fluid. This is normal. You may also have cramping for several days.   Activities after Your Discharge Follow these guidelines to help speed your recovery at home:  Don't drive if you are in pain or taking narcotic pain medicine. You may drive when you can safely slam on the brakes, turn the wheel forcefully, and rotate your torso comfortably. This is typically 4-7 days. Practice in a parking lot or side street prior to attempting to drive regularly.   Ask others to help with household chores for 4 weeks.  Don't do strenuous activities, exercises, or sports like vacuuming, tennis, squash, etc. until your doctor says it is safe to do so.  Walk as you feel able. Rest often since it may take a week or two for your energy level to return to normal.   You may climb stairs  Avoid constipation:   -Eat fruits, vegetables, and whole grains. Eat small meals as your appetite will take time to return to normal.   -Drink 6 to 8 glasses of water each day unless your doctor has told you to limit your fluids.   -Use a laxative or stool softener as needed if constipation becomes a problem. You may take Miralax, metamucil, Citrucil, Colace, Senekot, FiberCon, etc. If this does not relieve the  constipation, try two tablespoons of Milk Of Magnesia every 8 hours until your bowels move.   You may shower.   Do not get in a hot tub, swimming pool, etc. until your doctor agrees.  Do not douche, use tampons, or have sex until your doctor says it is okay, usually about 2 weeks.  Take your pain medicine when you need it. The medicine may not work as well if the pain is bad.  Take the medicines you were taking before surgery. Other medications you might need are pain medications (ibuprofen), medications for constipation (Colace) and nausea medications (Zofran).

## 2023-12-24 NOTE — Transfer of Care (Signed)
 Immediate Anesthesia Transfer of Care Note  Patient: Sally Wilson  Procedure(s) Performed: DILATATION AND CURETTAGE /HYSTEROSCOPY (Cervix)  Patient Location: PACU  Anesthesia Type:General  Level of Consciousness: awake, drowsy, and patient cooperative  Airway & Oxygen Therapy: Patient Spontanous Breathing  Post-op Assessment: Report given to RN and Post -op Vital signs reviewed and stable  Post vital signs: Reviewed and stable  Last Vitals:  Vitals Value Taken Time  BP 117/75 12/24/23 08:08  Temp    Pulse 103 12/24/23 08:13  Resp 30 12/24/23 08:13  SpO2 98 % 12/24/23 08:13  Vitals shown include unfiled device data.  Last Pain:  Vitals:   12/24/23 0635  PainSc: 0-No pain         Complications: No notable events documented.

## 2023-12-24 NOTE — Interval H&P Note (Signed)
 History and Physical Interval Note:  12/24/2023 7:23 AM  Sally Wilson  has presented today for surgery, with the diagnosis of endometrial fluid post menopausal.  The various methods of treatment have been discussed with the patient and family. After consideration of risks, benefits and other options for treatment, the patient has consented to  Procedure(s): DILATATION AND CURETTAGE /HYSTEROSCOPY (N/A) as a surgical intervention.  The patient's history has been reviewed, patient examined, no change in status, stable for surgery.  I have reviewed the patient's chart and labs.  Questions were answered to the patient's satisfaction.     Heather Penton

## 2023-12-24 NOTE — Anesthesia Postprocedure Evaluation (Signed)
 Anesthesia Post Note  Patient: Sally Wilson  Procedure(s) Performed: DILATATION AND CURETTAGE /HYSTEROSCOPY (Cervix)  Patient location during evaluation: PACU Anesthesia Type: General Level of consciousness: awake and alert Pain management: pain level controlled Vital Signs Assessment: post-procedure vital signs reviewed and stable Respiratory status: spontaneous breathing, nonlabored ventilation and respiratory function stable Cardiovascular status: blood pressure returned to baseline and stable Postop Assessment: no apparent nausea or vomiting Anesthetic complications: no   No notable events documented.   Last Vitals:  Vitals:   12/24/23 0830 12/24/23 0845  BP: 104/72 139/87  Pulse: 93 97  Resp: 20 16  Temp: 36.4 C 36.8 C  SpO2: 99% 96%    Last Pain:  Vitals:   12/24/23 0845  TempSrc: Temporal  PainSc: 0-No pain                 Fairy POUR Page Pucciarelli

## 2023-12-24 NOTE — Anesthesia Procedure Notes (Signed)
 Procedure Name: General with mask airway Date/Time: 12/24/2023 7:32 AM  Performed by: Ledora Duncan, CRNAPre-anesthesia Checklist: Patient identified, Emergency Drugs available, Suction available and Patient being monitored Patient Re-evaluated:Patient Re-evaluated prior to induction Oxygen Delivery Method: Simple face mask Induction Type: IV induction Placement Confirmation: positive ETCO2 and breath sounds checked- equal and bilateral Dental Injury: Teeth and Oropharynx as per pre-operative assessment

## 2023-12-25 ENCOUNTER — Encounter: Payer: Self-pay | Admitting: Obstetrics and Gynecology

## 2023-12-25 LAB — SURGICAL PATHOLOGY

## 2024-04-29 ENCOUNTER — Ambulatory Visit: Admitting: Dermatology
